# Patient Record
Sex: Male | Born: 1960
Health system: Southern US, Community
[De-identification: ages and names within clinical notes are randomized; demographics above are authoritative.]

## PROBLEM LIST (undated history)

## (undated) DIAGNOSIS — F32A Depression, unspecified: Secondary | ICD-10-CM

## (undated) DIAGNOSIS — H3320 Serous retinal detachment, unspecified eye: Secondary | ICD-10-CM

## (undated) DIAGNOSIS — F419 Anxiety disorder, unspecified: Secondary | ICD-10-CM

## (undated) DIAGNOSIS — N400 Enlarged prostate without lower urinary tract symptoms: Secondary | ICD-10-CM

## (undated) DIAGNOSIS — F329 Major depressive disorder, single episode, unspecified: Secondary | ICD-10-CM

## (undated) HISTORY — DX: Anxiety disorder, unspecified: F41.9

## (undated) HISTORY — DX: Benign prostatic hyperplasia without lower urinary tract symptoms: N40.0

## (undated) HISTORY — PX: DEEP NECK LYMPH NODE BIOPSY / EXCISION: SUR126

## (undated) HISTORY — PX: OTHER SURGICAL HISTORY: SHX169

## (undated) HISTORY — DX: Major depressive disorder, single episode, unspecified: F32.9

## (undated) HISTORY — PX: APPENDECTOMY: SHX54

## (undated) HISTORY — DX: Serous retinal detachment, unspecified eye: H33.20

## (undated) HISTORY — DX: Depression, unspecified: F32.A

---

## 2003-09-02 ENCOUNTER — Encounter: Payer: Self-pay | Admitting: Internal Medicine

## 2004-01-10 ENCOUNTER — Ambulatory Visit: Payer: Self-pay | Admitting: Internal Medicine

## 2004-01-17 ENCOUNTER — Ambulatory Visit: Payer: Self-pay | Admitting: Internal Medicine

## 2005-01-30 ENCOUNTER — Ambulatory Visit: Payer: Self-pay | Admitting: Internal Medicine

## 2005-02-06 ENCOUNTER — Ambulatory Visit: Payer: Self-pay | Admitting: Internal Medicine

## 2005-03-01 ENCOUNTER — Ambulatory Visit: Payer: Self-pay | Admitting: Internal Medicine

## 2006-02-11 ENCOUNTER — Ambulatory Visit: Payer: Self-pay | Admitting: Internal Medicine

## 2006-02-11 LAB — CONVERTED CEMR LAB
ALT: 21 units/L (ref 0–40)
AST: 26 units/L (ref 0–37)
Albumin: 4.1 g/dL (ref 3.5–5.2)
Alkaline Phosphatase: 53 units/L (ref 39–117)
BUN: 15 mg/dL (ref 6–23)
Basophils Absolute: 0 10*3/uL (ref 0.0–0.1)
Basophils Relative: 0.3 % (ref 0.0–1.0)
Bilirubin, Direct: 0.1 mg/dL (ref 0.0–0.3)
CO2: 31 meq/L (ref 19–32)
Calcium: 9.6 mg/dL (ref 8.4–10.5)
Chloride: 104 meq/L (ref 96–112)
Cholesterol: 176 mg/dL (ref 0–200)
Creatinine, Ser: 1.2 mg/dL (ref 0.4–1.5)
Eosinophils Absolute: 0 10*3/uL (ref 0.0–0.6)
Eosinophils Relative: 0.9 % (ref 0.0–5.0)
GFR calc Af Amer: 84 mL/min
GFR calc non Af Amer: 69 mL/min
Glucose, Bld: 84 mg/dL (ref 70–99)
HCT: 40.8 % (ref 39.0–52.0)
HDL: 43.4 mg/dL (ref >39.0)
Hemoglobin: 14.3 g/dL (ref 13.0–17.0)
LDL Cholesterol: 118 mg/dL — ABNORMAL HIGH (ref 0–99)
Lymphocytes Relative: 30.7 % (ref 12.0–46.0)
MCHC: 35 g/dL (ref 30.0–36.0)
MCV: 93.3 fL (ref 78.0–100.0)
Monocytes Absolute: 0.5 10*3/uL (ref 0.2–0.7)
Monocytes Relative: 10.7 % (ref 3.0–11.0)
Neutro Abs: 2.6 10*3/uL (ref 1.4–7.7)
Neutrophils Relative %: 57.4 % (ref 43.0–77.0)
Platelets: 207 10*3/uL (ref 150–400)
Potassium: 4.4 meq/L (ref 3.5–5.1)
RBC: 4.37 M/uL (ref 4.22–5.81)
RDW: 12.4 % (ref 11.5–14.6)
Sodium: 142 meq/L (ref 135–145)
TSH: 1.73 microintl units/mL (ref 0.35–5.50)
Total Bilirubin: 1 mg/dL (ref 0.3–1.2)
Total CHOL/HDL Ratio: 4.1
Total Protein: 7 g/dL (ref 6.0–8.3)
Triglycerides: 73 mg/dL (ref 0–149)
VLDL: 15 mg/dL (ref 0–40)
WBC: 4.5 10*3/uL (ref 4.5–10.5)

## 2006-02-15 ENCOUNTER — Ambulatory Visit: Payer: Self-pay | Admitting: Internal Medicine

## 2006-10-22 DIAGNOSIS — F411 Generalized anxiety disorder: Secondary | ICD-10-CM

## 2006-11-13 ENCOUNTER — Telehealth: Payer: Self-pay | Admitting: Internal Medicine

## 2007-01-30 ENCOUNTER — Ambulatory Visit: Payer: Self-pay | Admitting: Internal Medicine

## 2007-01-30 LAB — CONVERTED CEMR LAB
AST: 27 units/L (ref 0–37)
Basophils Relative: 0.1 % (ref 0.0–1.0)
Bilirubin Urine: NEGATIVE
Bilirubin, Direct: 0.2 mg/dL (ref 0.0–0.3)
Calcium: 10 mg/dL (ref 8.4–10.5)
Chloride: 105 meq/L (ref 96–112)
Cholesterol: 194 mg/dL (ref 0–200)
Creatinine, Ser: 1.2 mg/dL (ref 0.4–1.5)
Eosinophils Absolute: 0 10*3/uL (ref 0.0–0.6)
Eosinophils Relative: 1 % (ref 0.0–5.0)
Glucose, Urine, Semiquant: NEGATIVE
HCT: 41.4 % (ref 39.0–52.0)
Lymphocytes Relative: 30.8 % (ref 12.0–46.0)
MCHC: 33.1 g/dL (ref 30.0–36.0)
Monocytes Absolute: 0.5 10*3/uL (ref 0.2–0.7)
Platelets: 206 10*3/uL (ref 150–400)
RDW: 12.2 % (ref 11.5–14.6)
Sodium: 143 meq/L (ref 135–145)
TSH: 1.31 microintl units/mL (ref 0.35–5.50)
Total CHOL/HDL Ratio: 4.9
Triglycerides: 84 mg/dL (ref 0–149)
WBC Urine, dipstick: NEGATIVE
WBC: 4.6 10*3/uL (ref 4.5–10.5)

## 2007-02-18 ENCOUNTER — Ambulatory Visit: Payer: Self-pay | Admitting: Internal Medicine

## 2007-08-05 ENCOUNTER — Ambulatory Visit: Payer: Self-pay | Admitting: Internal Medicine

## 2007-08-06 ENCOUNTER — Telehealth: Payer: Self-pay | Admitting: Internal Medicine

## 2007-08-06 ENCOUNTER — Telehealth: Payer: Self-pay | Admitting: *Deleted

## 2007-08-07 ENCOUNTER — Ambulatory Visit: Payer: Self-pay | Admitting: Professional

## 2007-08-08 ENCOUNTER — Telehealth: Payer: Self-pay | Admitting: Internal Medicine

## 2007-08-11 ENCOUNTER — Ambulatory Visit: Payer: Self-pay | Admitting: Professional

## 2007-08-12 ENCOUNTER — Telehealth: Payer: Self-pay | Admitting: Internal Medicine

## 2007-08-18 ENCOUNTER — Ambulatory Visit: Payer: Self-pay | Admitting: Professional

## 2007-08-25 ENCOUNTER — Ambulatory Visit: Payer: Self-pay | Admitting: Professional

## 2007-09-01 ENCOUNTER — Ambulatory Visit: Payer: Self-pay | Admitting: Professional

## 2007-09-10 ENCOUNTER — Encounter: Payer: Self-pay | Admitting: Internal Medicine

## 2007-12-30 ENCOUNTER — Ambulatory Visit: Payer: Self-pay | Admitting: Family Medicine

## 2008-02-18 ENCOUNTER — Ambulatory Visit: Payer: Self-pay | Admitting: Internal Medicine

## 2008-02-18 LAB — CONVERTED CEMR LAB
ALT: 22 units/L (ref 0–53)
Albumin: 4.2 g/dL (ref 3.5–5.2)
Alkaline Phosphatase: 59 units/L (ref 39–117)
BUN: 18 mg/dL (ref 6–23)
Bilirubin, Direct: 0.1 mg/dL (ref 0.0–0.3)
Blood in Urine, dipstick: NEGATIVE
Calcium: 9.9 mg/dL (ref 8.4–10.5)
Chloride: 108 meq/L (ref 96–112)
Creatinine, Ser: 1.3 mg/dL (ref 0.4–1.5)
Eosinophils Absolute: 0 10*3/uL (ref 0.0–0.7)
GFR calc Af Amer: 76 mL/min
Glucose, Urine, Semiquant: NEGATIVE
Hemoglobin: 14.1 g/dL (ref 13.0–17.0)
Ketones, urine, test strip: NEGATIVE
Monocytes Absolute: 0.6 10*3/uL (ref 0.1–1.0)
Neutro Abs: 2.8 10*3/uL (ref 1.4–7.7)
Neutrophils Relative %: 51.5 % (ref 43.0–77.0)
Platelets: 181 10*3/uL (ref 150–400)
Potassium: 5.3 meq/L — ABNORMAL HIGH (ref 3.5–5.1)
Protein, U semiquant: NEGATIVE
RBC: 4.29 M/uL (ref 4.22–5.81)
Sodium: 146 meq/L — ABNORMAL HIGH (ref 135–145)
Total CHOL/HDL Ratio: 4.6
Triglycerides: 71 mg/dL (ref 0–149)
WBC: 5.3 10*3/uL (ref 4.5–10.5)

## 2008-03-15 ENCOUNTER — Ambulatory Visit: Payer: Self-pay | Admitting: Internal Medicine

## 2008-09-29 ENCOUNTER — Ambulatory Visit: Payer: Self-pay | Admitting: Family Medicine

## 2009-03-10 ENCOUNTER — Ambulatory Visit: Payer: Self-pay | Admitting: Internal Medicine

## 2009-03-10 LAB — CONVERTED CEMR LAB
ALT: 17 units/L (ref 0–53)
Albumin: 4.1 g/dL (ref 3.5–5.2)
Alkaline Phosphatase: 55 units/L (ref 39–117)
Basophils Absolute: 0 10*3/uL (ref 0.0–0.1)
Basophils Relative: 0 % (ref 0.0–3.0)
Bilirubin Urine: NEGATIVE
GFR calc non Af Amer: 84.3 mL/min (ref >60)
HCT: 40.5 % (ref 39.0–52.0)
HDL: 47.8 mg/dL (ref >39.00)
Hemoglobin: 13.4 g/dL (ref 13.0–17.0)
Ketones, urine, test strip: NEGATIVE
LDL Cholesterol: 103 mg/dL — ABNORMAL HIGH (ref 0–99)
Lymphocytes Relative: 37.8 % (ref 12.0–46.0)
Lymphs Abs: 1.6 10*3/uL (ref 0.7–4.0)
Monocytes Absolute: 0.4 10*3/uL (ref 0.1–1.0)
Monocytes Relative: 8.6 % (ref 3.0–12.0)
Neutro Abs: 2.3 10*3/uL (ref 1.4–7.7)
Neutrophils Relative %: 52.7 % (ref 43.0–77.0)
Nitrite: NEGATIVE
Potassium: 4.6 meq/L (ref 3.5–5.1)
Protein, U semiquant: NEGATIVE
RBC: 4.2 M/uL — ABNORMAL LOW (ref 4.22–5.81)
Specific Gravity, Urine: 1.02
TSH: 1.32 microintl units/mL (ref 0.35–5.50)
Total CHOL/HDL Ratio: 3
Total Protein: 7.3 g/dL (ref 6.0–8.3)

## 2009-03-24 ENCOUNTER — Ambulatory Visit: Payer: Self-pay | Admitting: Internal Medicine

## 2009-06-13 ENCOUNTER — Ambulatory Visit: Payer: Self-pay | Admitting: Internal Medicine

## 2009-06-13 DIAGNOSIS — R519 Headache, unspecified: Secondary | ICD-10-CM | POA: Insufficient documentation

## 2009-06-13 DIAGNOSIS — R51 Headache: Secondary | ICD-10-CM | POA: Insufficient documentation

## 2009-06-16 ENCOUNTER — Telehealth: Payer: Self-pay | Admitting: Internal Medicine

## 2009-06-16 ENCOUNTER — Ambulatory Visit: Payer: Self-pay | Admitting: Cardiology

## 2009-06-17 ENCOUNTER — Telehealth: Payer: Self-pay | Admitting: Internal Medicine

## 2009-06-18 ENCOUNTER — Emergency Department (HOSPITAL_COMMUNITY): Admission: EM | Admit: 2009-06-18 | Discharge: 2009-06-18 | Payer: Self-pay | Admitting: Family Medicine

## 2009-06-20 ENCOUNTER — Telehealth (INDEPENDENT_AMBULATORY_CARE_PROVIDER_SITE_OTHER): Payer: Self-pay | Admitting: *Deleted

## 2009-06-21 ENCOUNTER — Telehealth: Payer: Self-pay | Admitting: Internal Medicine

## 2009-06-22 ENCOUNTER — Telehealth: Payer: Self-pay | Admitting: Internal Medicine

## 2009-07-14 ENCOUNTER — Telehealth: Payer: Self-pay
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
## 2010-01-31 NOTE — Progress Notes (Signed)
Summary: headaches  STAT CT  Phone Note Call from Patient   Caller: Patient Call For: Birdie Sons MD Summary of Call: Has blurred vision in right eye and has gotten a little better as the day goes on. [phone]  [phone]  [phone] Feels ok and no headache in the am, but in the afternoon the headache comes back and lasts through the night.  Cannot even focus.  Initial call taken by: Lynann Beaver CMA,  June 16, 2009 1:35 PM  Follow-up for Phone Call        head ct without contrast Follow-up by: Gordy Savers  MD,  June 16, 2009 1:50 PM  Additional Follow-up for Phone Call Additional follow up Details #1::        Appt Scheduled.  Pt aware. Additional Follow-up by: Corky Mull,  June 17, 2009 9:17 AM
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
## 2010-01-31 NOTE — Progress Notes (Signed)
Summary: REQ FOR RETURN CALL  Phone Note Call from Patient   Caller: Patient  [phone] Summary of Call: Pt called to speak with Dr Kirtland Bouchard or Selena Batten, RN.... Pt adv that he feels that he is ready to go back to work but his employer is stating that he will need to have a work note saying he is no longer contagious.... Pt wants to know if he needs to come in for OV to determine this?... Pt requesting a return call at [phone].  Initial call taken by: Debbra Riding,  June 22, 2009 2:37 PM  Follow-up for Phone Call        forward to [facility] - Dr. Cato Mulligan pt.  Follow-up by: Duard Brady LPN,  June 23, 2009 8:28 AM  Additional Follow-up for Phone Call Additional follow up Details #1::        2nd VM today, pt wants to know what steps to take to go back to work. Sid Falcon LPN  June 23, 2009 3:42 PM     Additional Follow-up for Phone Call Additional follow up Details #2::    can return to work---he never was contagious Follow-up by: Birdie Sons MD,  June 23, 2009 4:48 PM  Additional Follow-up for Phone Call Additional follow up Details #3:: Details for Additional Follow-up Action Taken: will do. Additional Follow-up by: Gladis Riffle, RN,  June 24, 2009 10:19 AM   Appended Document: REQ FOR RETURN CALL Pt is that of Dr Cato Mulligan but was seen by Dr Kirtland Bouchard during OV for shingles.... Pt thought he needed to go through Dr K for return to work.

## 2010-01-31 NOTE — Progress Notes (Signed)
Summary: headaches  Phone Note Call from Patient   Caller: Patient Call For: Birdie Sons MD Summary of Call: Pt needs RX stronger for headace ?? Aleve?  ASA? [phone]  Initial call taken by: Lynann Beaver CMA,  June 17, 2009 9:38 AM  Follow-up for Phone Call        generic Vicodin 5/500, number 50, 1 or two every 6 hours as needed for pain Follow-up by: Gordy Savers  MD,  June 17, 2009 12:38 PM    New/Updated Medications: HYDROCODONE-ACETAMINOPHEN 5-500 MG TABS (HYDROCODONE-ACETAMINOPHEN) one - two by mouth q 6 hours as needed pain Prescriptions: HYDROCODONE-ACETAMINOPHEN 5-500 MG TABS (HYDROCODONE-ACETAMINOPHEN) one - two by mouth q 6 hours as needed pain  #50 x 0   Entered by:   Lynann Beaver CMA   Authorized by:   Gordy Savers  MD   Signed by:   Lynann Beaver CMA on 06/17/2009   Method used:   Telephoned to ...       [facility] [address] [id number]       [address]       [place], [place]  [address]       Ph: [phone]       Fax: [phone]   RxID:   [id number]  Pt notified.

## 2010-01-31 NOTE — Progress Notes (Signed)
Summary: FMLA ppwk - refax - Dr. Cato Mulligan pt.  Phone Note Call from Patient   Caller: Patient Call For: Birdie Sons MD Summary of Call: questions about FMLA ppwk - filled out ? faxed?  Needs to go to [employer] fax # [phone] call pt back at [phone] Initial call taken by: Duard Brady LPN,  July 14, 2009 12:04 PM  Follow-up for Phone Call        attempt to call - ans mach - LMTCB   he is a pt of Dr. Cato Mulligan - Dr. Amador Cunas filled out ppwk and it was faxed to [facility] on 07/01/09 , [facility] will refax today. KIK Follow-up by: Duard Brady LPN,  July 14, 2009 12:07 PM

## 2010-01-31 NOTE — Progress Notes (Signed)
Summary: shingles  Phone Note Call from Patient   Caller: Patient Call For: Birdie Sons MD Summary of Call: Pt was diagnosed with shingles at [facility] Saturday.  Now is complaining of nausea.  Is on an antiviral.  [phone] [facility] Valtrex  was prescribed. Initial call taken by: Lynann Beaver CMA,  June 21, 2009 2:37 PM  Follow-up for Phone Call        generic Phenergan 25 mg, number 30, 1 every 4 hours as needed for nausea Follow-up by: Gordy Savers  MD,  June 21, 2009 3:15 PM    New/Updated Medications: PROMETHAZINE HCL 25 MG TABS (PROMETHAZINE HCL) one by mouth q 4 hours as needed nausea Prescriptions: PROMETHAZINE HCL 25 MG TABS (PROMETHAZINE HCL) one by mouth q 4 hours as needed nausea  #30 x 0   Entered by:   Lynann Beaver CMA   Authorized by:   Gordy Savers  MD   Signed by:   Lynann Beaver CMA on 06/21/2009   Method used:   [facility]        [facility] [address] [id number]       [address]       [place], [place]  [address]       Ph: [phone]       Fax: [phone]   RxID:   [id number]  Pt. notified.

## 2010-01-31 NOTE — Assessment & Plan Note (Signed)
Summary: cpx//ccm   Vital Signs:  Patient profile:   50 year old male Height:      71 inches Weight:      165 pounds BMI:     23.10 Temp:     98.0 degrees F oral BP sitting:   110 / 80  (left arm) Cuff size:   regular  Vitals Entered By: Kern Reap CMA Duncan Dull) (March 24, 2009 8:14 AM) CC: yearly physical Is Patient Diabetic? No   CC:  yearly physical.  History of Present Illness: cpx  has some generalized joint stiffness in the a.m. resolves once he is up and moving.  All other systems reviewed and were negative   Current Problems (verified): 1)  Physical Examination  (ICD-V70.0) 2)  Anxiety  (ICD-300.00)  Current Medications (verified): 1)  Multivitamins   Tabs (Multiple Vitamin) .... Once Daily 2)  Aspirin 81 Mg  Tbec (Aspirin) .... Once Daily 3)  Gabapentin 300 Mg Caps (Gabapentin) .... Take One Tab in The Am and Two Tabs in The Pm  Allergies (verified): No Known Drug Allergies  Past History:  Past Medical History: Last updated: 10/22/2006 Anxiety  Past Surgical History: Last updated: 03-01-07 Denies surgical history  Family History: Last updated: 03-01-07 mother deceased valvular heart dzs 67 yo Family History of CAD Male 1st degree relative -father Family History of Prostate CA 1st degree relative -father Family History Hypertension-father brother with psoriatic arthritis  Social History: Last updated: 03/01/07 Occupation: Married Never Smoked Regular exercise-no  Risk Factors: Exercise: no (03-01-2007)  Risk Factors: Smoking Status: never (2007/03/01)  Review of Systems       All other systems reviewed and were negative   Physical Exam  General:  alert and well-developed.   Head:  normocephalic and atraumatic.   Eyes:  pupils equal and pupils round.   Ears:  R ear normal and L ear normal.   Neck:  No deformities, masses, or tenderness noted. Chest Wall:  no deformities and no tenderness.   Lungs:  Normal respiratory  effort, chest expands symmetrically. Lungs are clear to auscultation, no crackles or wheezes. Heart:  normal rate and regular rhythm.   Abdomen:  soft and non-tender.   Rectal:  No external abnormalities noted. Normal sphincter tone. No rectal masses or tenderness. Prostate:  no gland enlargement and no nodules.   Msk:  No deformity or scoliosis noted of thoracic or lumbar spine.   Pulses:  R radial normal and L radial normal.   Neurologic:  cranial nerves II-XII intact and gait normal.     Impression & Recommendations:  Problem # 1:  PHYSICAL EXAMINATION (ICD-V70.0) health maint UTD discussed need for regular exercise  Problem # 2:  ANXIETY (ICD-300.00) sees psychiatry (neuronitn) (plovsky) meds have been stable. if asked, I'll be happy to write for neurontin  Complete Medication List: 1)  Multivitamins Tabs (Multiple vitamin) .... Once daily 2)  Aspirin 81 Mg Tbec (Aspirin) .... Once daily 3)  Gabapentin 300 Mg Caps (Gabapentin) .... Take one tab in the am and two tabs in the pm   Immunization History:  Tetanus/Td Immunization History:    Tetanus/Td:  historical (01/02/2003)  Influenza Immunization History:    Influenza:  historical (11/01/2008)   Preventive Care Screening  Last Tetanus Booster:    Date:  01/02/2003    Results:  Historical

## 2010-04-02 HISTORY — PX: RETINAL DETACHMENT REPAIR W/ SCLERAL BUCKLE LE: SHX2338

## 2010-04-09 ENCOUNTER — Emergency Department (HOSPITAL_COMMUNITY): Payer: Managed Care, Other (non HMO)

## 2010-04-09 ENCOUNTER — Emergency Department (HOSPITAL_COMMUNITY)
Admission: EM | Admit: 2010-04-09 | Discharge: 2010-04-09 | Disposition: A | Discharge status: Home / Self Care | Payer: Managed Care, Other (non HMO) | Attending: Emergency Medicine | Admitting: Emergency Medicine

## 2010-04-09 DIAGNOSIS — H33009 Unspecified retinal detachment with retinal break, unspecified eye: Secondary | ICD-10-CM | POA: Insufficient documentation

## 2010-04-09 LAB — BASIC METABOLIC PANEL
BUN: 17 mg/dL (ref 6–23)
GFR calc non Af Amer: >60 mL/min (ref >60)
Glucose, Bld: 93 mg/dL (ref 70–99)
Potassium: 3.6 mEq/L (ref 3.5–5.1)

## 2010-04-09 LAB — DIFFERENTIAL
Eosinophils Relative: 1 % (ref 0–5)
Lymphocytes Relative: 31 % (ref 12–46)
Lymphs Abs: 1.9 10*3/uL (ref 0.7–4.0)
Monocytes Absolute: 0.7 10*3/uL (ref 0.1–1.0)

## 2010-04-09 LAB — CBC
HCT: 38.2 % — ABNORMAL LOW (ref 39.0–52.0)
MCV: 92 fL (ref 78.0–100.0)
RDW: 13.1 % (ref 11.5–15.5)
WBC: 6.3 10*3/uL (ref 4.0–10.5)

## 2010-05-03 ENCOUNTER — Other Ambulatory Visit (INDEPENDENT_AMBULATORY_CARE_PROVIDER_SITE_OTHER): Payer: Managed Care, Other (non HMO) | Admitting: Internal Medicine

## 2010-05-03 DIAGNOSIS — Z0000 Encounter for general adult medical examination without abnormal findings: Secondary | ICD-10-CM

## 2010-05-03 LAB — BASIC METABOLIC PANEL
BUN: 18 mg/dL (ref 6–23)
CO2: 30 mEq/L (ref 19–32)
Calcium: 9.7 mg/dL (ref 8.4–10.5)
Creatinine, Ser: 1.1 mg/dL (ref 0.4–1.5)
Glucose, Bld: 88 mg/dL (ref 70–99)

## 2010-05-03 LAB — HEPATIC FUNCTION PANEL
Alkaline Phosphatase: 57 U/L (ref 39–117)
Bilirubin, Direct: 0.1 mg/dL (ref 0.0–0.3)

## 2010-05-03 LAB — POCT URINALYSIS DIPSTICK
Blood, UA: NEGATIVE
Ketones, UA: NEGATIVE
Leukocytes, UA: NEGATIVE
Nitrite, UA: NEGATIVE
Protein, UA: NEGATIVE
pH, UA: 7

## 2010-05-03 LAB — CBC WITH DIFFERENTIAL/PLATELET
Basophils Absolute: 0 10*3/uL (ref 0.0–0.1)
Eosinophils Absolute: 0 10*3/uL (ref 0.0–0.7)
Lymphocytes Relative: 29 % (ref 12.0–46.0)
MCHC: 33.4 g/dL (ref 30.0–36.0)
Neutrophils Relative %: 60.9 % (ref 43.0–77.0)
Platelets: 246 10*3/uL (ref 150.0–400.0)
RBC: 4 Mil/uL — ABNORMAL LOW (ref 4.22–5.81)
RDW: 14.1 % (ref 11.5–14.6)

## 2010-05-03 LAB — LIPID PANEL
HDL: 45.8 mg/dL (ref >39.00)
Total CHOL/HDL Ratio: 4
Triglycerides: 83 mg/dL (ref 0.0–149.0)
VLDL: 16.6 mg/dL (ref 0.0–40.0)

## 2010-05-08 ENCOUNTER — Other Ambulatory Visit (INDEPENDENT_AMBULATORY_CARE_PROVIDER_SITE_OTHER): Payer: Managed Care, Other (non HMO) | Admitting: Internal Medicine

## 2010-05-08 DIAGNOSIS — D509 Iron deficiency anemia, unspecified: Secondary | ICD-10-CM

## 2010-05-08 LAB — IBC PANEL: Transferrin: 278.8 mg/dL (ref 212.0–360.0)

## 2010-05-08 LAB — FERRITIN: Ferritin: 45.7 ng/mL (ref 22.0–322.0)

## 2010-05-09 ENCOUNTER — Encounter: Payer: Self-pay | Admitting: Internal Medicine

## 2010-05-10 ENCOUNTER — Ambulatory Visit (INDEPENDENT_AMBULATORY_CARE_PROVIDER_SITE_OTHER): Payer: Managed Care, Other (non HMO) | Admitting: Internal Medicine

## 2010-05-10 ENCOUNTER — Encounter: Payer: Self-pay | Admitting: Internal Medicine

## 2010-05-10 VITALS — BP 110/76 | HR 76 | Temp 98.2°F | Ht 71.5 in | Wt 158.0 lb

## 2010-05-10 DIAGNOSIS — Z0000 Encounter for general adult medical examination without abnormal findings: Secondary | ICD-10-CM

## 2010-05-10 DIAGNOSIS — D649 Anemia, unspecified: Secondary | ICD-10-CM

## 2010-05-10 NOTE — Assessment & Plan Note (Signed)
Reviewed labs--- No concerns Will repeat CBC in 3 months He needs screening colonoscopy

## 2010-05-10 NOTE — Progress Notes (Signed)
  Subjective:    Patient ID: Alexander Gonzales, male    DOB: [birth year], 50 y.o.   MRN: [id number]  HPI cpx  Anxiety---sees psychiatry  Past Medical History  Diagnosis Date  . Anxiety    No past surgical history on file.  reports that he quit smoking about 32 years ago. His smoking use included Cigarettes. He does not have any smokeless tobacco history on file. His alcohol and drug histories not on file. family history includes Arthritis in his brother; Heart disease in his father and mother; Hypertension in his father; and Prostate cancer in his father. No Known Allergies   Review of Systems  patient denies chest pain, shortness of breath, orthopnea. Denies lower extremity edema, abdominal pain,  change in bowel movements. Patient denies rashes, musculoskeletal complaints. No other specific complaints in a complete review of systems. Some weight loss with recent stressors: work, detached retina.     Objective:   Physical Exam Well-developed male in no acute distress. HEENT exam atraumatic, normocephalic, extraocular muscles are intact. Conjunctivae are pink without exudate. Neck is supple without lymphadenopathy, thyromegaly, jugular venous distention. Chest is clear to auscultation without increased work of breathing. Cardiac exam S1-S2 are regular. The PMI is normal. No significant murmurs or gallops. Abdominal exam active bowel sounds, soft, nontender. No abdominal bruits. Extremities no clubbing cyanosis or edema. Peripheral pulses are normal without bruits. Neurologic exam alert and oriented without any motor or sensory deficits. Rectal exam normal tone prostate normal size without masses or asymmetry.        Assessment & Plan:

## 2010-06-29 ENCOUNTER — Ambulatory Visit (AMBULATORY_SURGERY_CENTER): Payer: Managed Care, Other (non HMO) | Admitting: *Deleted

## 2010-06-29 VITALS — Ht 72.0 in | Wt 158.2 lb

## 2010-06-29 DIAGNOSIS — Z1211 Encounter for screening for malignant neoplasm of colon: Secondary | ICD-10-CM

## 2010-06-29 MED ORDER — PEG-KCL-NACL-NASULF-NA ASC-C 100 G PO SOLR: ORAL | Status: DC

## 2010-07-12 ENCOUNTER — Telehealth: Payer: Self-pay | Admitting: Internal Medicine

## 2010-07-13 ENCOUNTER — Telehealth: Payer: Self-pay | Admitting: *Deleted

## 2010-07-13 NOTE — Telephone Encounter (Signed)
Pt. Returned my call.  He'd eaten some salad and corn the other day and wanted to be reassured he didn't have to [facility].  Advised him to drink plenty of liquids.

## 2010-07-14 ENCOUNTER — Encounter: Payer: Self-pay | Admitting: Internal Medicine

## 2010-07-14 ENCOUNTER — Ambulatory Visit (AMBULATORY_SURGERY_CENTER): Payer: Managed Care, Other (non HMO) | Admitting: Internal Medicine

## 2010-07-14 VITALS — BP 100/58 | HR 61 | Temp 97.2°F | Resp 18 | Ht 72.0 in | Wt 160.0 lb

## 2010-07-14 DIAGNOSIS — Z1211 Encounter for screening for malignant neoplasm of colon: Secondary | ICD-10-CM

## 2010-07-14 DIAGNOSIS — K648 Other hemorrhoids: Secondary | ICD-10-CM

## 2010-07-14 HISTORY — PX: COLONOSCOPY: SHX174

## 2010-07-14 MED ORDER — SODIUM CHLORIDE 0.9 % IV SOLN: 500.0000 mL | INTRAVENOUS | Status: DC

## 2010-07-14 NOTE — Patient Instructions (Signed)
Normal colon  Internal hemorrhoids  Recall colonoscopy in 10 years

## 2010-07-17 ENCOUNTER — Telehealth: Payer: Self-pay | Admitting: *Deleted

## 2010-07-17 NOTE — Telephone Encounter (Signed)
No identifier on answering machine, no message left. 

## 2010-08-09 NOTE — Op Note (Signed)
NAME:  Alexander Gonzales, Alexander Gonzales            ACCOUNT NO.:  [id number]  MEDICAL RECORD NO.:  [id number]           PATIENT TYPE:  E  LOCATION:  [facility]                         FACILITY:  [facility]  PHYSICIAN:  Alford Highland. Rankin, M.D.   DATE OF BIRTH:  [birth year]  DATE OF PROCEDURE:  04/09/2010 DATE OF DISCHARGE:                              OPERATIVE REPORT   PREOPERATIVE DIAGNOSIS:  Rhegmatogenous retinal detachment, macula off, phakic of the left eye.  POSTOPERATIVE DIAGNOSIS:  Rhegmatogenous retinal detachment, left eye, macula off, phakic.  PROCEDURES: 1. Scleral buckle retinal cryopexy, left eye using 240, 70, and 287     elements. 2. Injection of vitreous substitute SF6 100% concentration, 0.25 mL. 3. Aqueous paracentesis to soften the globe.  SURGEON:  Alford Highland. Rankin, MD  ANESTHESIA:  General endotracheal anesthesia.  INDICATIONS FOR PROCEDURE:  The patient is a 50 year old man who has profound vision loss on basis of rhegmatogenous detachment in the left eye found yesterday in the office to have a small horseshoe tip break at the 2 o'clock position with a wedge shape about 1 to 1-1/2 clock hour detachment extending from the retinal periphery posteriorly to the macula splitting the foveal region.  B-scan confirmed that there was minimal separation of vitreous in the remainder of the retina suggesting poor vitreous syneresis, thus not a good candidate for pneumatic retinopexy alone in my judgment.  For this reason, the patient was scheduled for scleral buckle retinal cryopexy.  The patient understands the risks of anesthesia, including the rare occurrence of death, loss of the eye including but not limited to from the condition as well as surgical repair, hemorrhage, infection, scarring, need for further surgery, no change in vision, loss of vision, progressive disease despite intervention.  Proper signed consent was obtained.  DESCRIPTION OF PROCEDURE:  The patient was taken to the  operating room. In the operating room, appropriate monitors followed by mild sedation. Left periocular region was identified by the operating staff and then general endotracheal anesthesia was instituted without difficulty.  Left periocular region was sterilely prepped and draped in usual ophthalmic fashion.  Lid speculum was applied.  Conjunctiva peritomy fashioned at 360 degrees.  Rectus muscles isolated on 2-0 silk ties.  Ophthalmoscopy confirmed the extent of the detachment and location of the tear. Retinal cryopexy was applied to the tear.  The patient had detachment from the 1 o'clock hour straddle to the 2-3:30 meridian.  Break was at 2 o'clock.  287 explant was selected, placed into the lateral rectus muscles with a 240 encircling band which was temporarily secured in the inferonasal quadrant with a 70 sleeve.  The buckle was temporarily tied. Anterior chamber was then entered with a 30-gauge needle to soften the globe to allow for excellent scleral indentation.  Peripherally, the retinal detachment was shallow but posteriorly it was more pronounced and with subretinal fluid.  This was not going to be an easy case to drain it externally.  The buckle was then tightened to appropriate tension.  Excellent support to the break was confirmed.  At this time, the remainder of the buckle was then secured with 5-0 Mersilene in each  of the quadrants.  The band was properly secured to appropriate tension to provide vitreous base support.  No complications occurred.  Band was irrigated with bug juice.  Conjunctival tenons were then brought forward and closed with interrupted 7-0 Vicryl sutures. Second look at ophthalmoscopy confirmed that there was still subretinal fluid and a small fold through the break at 2 o'clock and for this reason, I was concerned that this might lead to persistent detachment. For this reason, I elected to place filtered SF6 0.2 mL into the vitreous cavity with direct  observation.  This will allow for positioning and to re-flatten the macula but also to secure the break temporally.  The patient tolerated the procedure without complication.  The patient was taken to PACU in good stable addition.  After sterile patch and Fox shield were then applied, he was awakened from anesthesia.     Alford Highland Rankin, M.D.     GAR/MEDQ  D:  04/09/2010  T:  04/10/2010  Job:  [id number]  cc:   Richarda Overlie, M.D. Marcelyn Bruins. Nile Riggs, M.D.  Electronically Signed by Fawn Kirk M.D. on 08/09/2010 02:39:34 PM

## 2010-08-10 ENCOUNTER — Other Ambulatory Visit (INDEPENDENT_AMBULATORY_CARE_PROVIDER_SITE_OTHER): Payer: Managed Care, Other (non HMO)

## 2010-08-10 DIAGNOSIS — N529 Male erectile dysfunction, unspecified: Secondary | ICD-10-CM

## 2010-08-10 DIAGNOSIS — D649 Anemia, unspecified: Secondary | ICD-10-CM

## 2010-08-10 LAB — CBC WITH DIFFERENTIAL/PLATELET
Basophils Relative: 0.6 % (ref 0.0–3.0)
Eosinophils Absolute: 0 10*3/uL (ref 0.0–0.7)
Hemoglobin: 13 g/dL (ref 13.0–17.0)
MCHC: 33.6 g/dL (ref 30.0–36.0)
MCV: 95.3 fl (ref 78.0–100.0)
Monocytes Absolute: 0.3 10*3/uL (ref 0.1–1.0)
Neutro Abs: 2 10*3/uL (ref 1.4–7.7)
Neutrophils Relative %: 57.5 % (ref 43.0–77.0)
RBC: 4.05 Mil/uL — ABNORMAL LOW (ref 4.22–5.81)

## 2010-11-06 NOTE — Telephone Encounter (Signed)
done

## 2011-05-10 ENCOUNTER — Other Ambulatory Visit (INDEPENDENT_AMBULATORY_CARE_PROVIDER_SITE_OTHER): Payer: Managed Care, Other (non HMO)

## 2011-05-10 DIAGNOSIS — Z0000 Encounter for general adult medical examination without abnormal findings: Secondary | ICD-10-CM

## 2011-05-10 LAB — BASIC METABOLIC PANEL
BUN: 25 mg/dL — ABNORMAL HIGH (ref 6–23)
Calcium: 9.7 mg/dL (ref 8.4–10.5)
GFR: 79.87 mL/min (ref >60.00)
Glucose, Bld: 86 mg/dL (ref 70–99)

## 2011-05-10 LAB — HEPATIC FUNCTION PANEL
Alkaline Phosphatase: 46 U/L (ref 39–117)
Bilirubin, Direct: 0 mg/dL (ref 0.0–0.3)

## 2011-05-10 LAB — POCT URINALYSIS DIPSTICK
Bilirubin, UA: NEGATIVE
Ketones, UA: NEGATIVE
Leukocytes, UA: NEGATIVE

## 2011-05-10 LAB — LIPID PANEL
Cholesterol: 212 mg/dL — ABNORMAL HIGH (ref 0–200)
HDL: 55.5 mg/dL (ref >39.00)
VLDL: 11.6 mg/dL (ref 0.0–40.0)

## 2011-05-10 LAB — CBC WITH DIFFERENTIAL/PLATELET
Basophils Absolute: 0 10*3/uL (ref 0.0–0.1)
Lymphocytes Relative: 32.9 % (ref 12.0–46.0)
Monocytes Relative: 9 % (ref 3.0–12.0)
Platelets: 187 10*3/uL (ref 150.0–400.0)
RDW: 13.9 % (ref 11.5–14.6)

## 2011-05-17 ENCOUNTER — Encounter: Payer: Self-pay | Admitting: Internal Medicine

## 2011-05-17 ENCOUNTER — Ambulatory Visit (INDEPENDENT_AMBULATORY_CARE_PROVIDER_SITE_OTHER): Payer: Managed Care, Other (non HMO) | Admitting: Internal Medicine

## 2011-05-17 VITALS — BP 100/60 | HR 72 | Temp 98.6°F | Resp 16 | Wt 173.0 lb

## 2011-05-17 DIAGNOSIS — Z0000 Encounter for general adult medical examination without abnormal findings: Secondary | ICD-10-CM

## 2011-05-17 NOTE — Progress Notes (Signed)
Patient ID: Alexander Gonzales, male   DOB: Sep 29, 1960, 51 y.o.   MRN: [id number] cpx  Hx of shingles--wonders whether shingles vaccine is appropriate  Past Medical History  Diagnosis Date  . Anxiety   . Detached retina     April 2012  . Depression     History   Social History  . Marital Status: Married    Spouse Name: N/A    Number of Children: N/A  . Years of Education: N/A   Occupational History  . Not on file.   Social History Main Topics  . Smoking status: Former Smoker    Types: Cigarettes    Quit date: 05/10/1978  . Smokeless tobacco: Not on file  . Alcohol Use: Yes     occasional alcohol intake  . Drug Use: No  . Sexually Active: Not on file   Other Topics Concern  . Not on file   Social History Narrative  . No narrative on file    Past Surgical History  Procedure Date  . Retinal detachment repair w/ scleral buckle le 04/2010  . Deep neck lymph node biopsy / excision     rt neck  . Appendectomy   . Fracture left ankle   . Colonoscopy 07/14/2010    hemorrhoids    Family History  Problem Relation Age of Onset  . Heart disease Mother   . Heart disease Father   . Prostate cancer Father   . Hypertension Father   . Arthritis Brother     psoriatic    No Known Allergies  Current Outpatient Prescriptions on File Prior to Visit  Medication Sig Dispense Refill  . aspirin 81 MG tablet Take 81 mg by mouth daily.        . clorazepate (TRANXENE) 15 MG tablet Take 15 mg by mouth 2 (two) times daily.        Marland Kitchen FLUoxetine (PROZAC) 40 MG capsule Take 40 mg by mouth daily.        Marland Kitchen gabapentin (NEURONTIN) 300 MG capsule Take 600 mg by mouth 2 (two) times daily.       . Multiple Vitamin (MULTIVITAMIN) tablet Take 1 tablet by mouth daily.        Marland Kitchen ketorolac (ACULAR) 0.5 % ophthalmic solution        Current Facility-Administered Medications on File Prior to Visit  Medication Dose Route Frequency Provider Last Rate Last Dose  . 0.9 %  sodium chloride infusion   500 mL Intravenous Continuous Iva Boop, MD         patient denies chest pain, shortness of breath, orthopnea. Denies lower extremity edema, abdominal pain, change in appetite, change in bowel movements. Patient denies rashes, musculoskeletal complaints. No other specific complaints in a complete review of systems.   BP 100/60  Pulse 72  Temp(Src) 98.6 F (37 C) (Oral)  Resp 16  Wt 173 lb (78.472 kg) Well-developed male in no acute distress. HEENT exam atraumatic, normocephalic, extraocular muscles are intact. Conjunctivae are pink without exudate. Neck is supple without lymphadenopathy, thyromegaly, jugular venous distention. Chest is clear to auscultation without increased work of breathing. Cardiac exam S1-S2 are regular. The PMI is normal. No significant murmurs or gallops. Abdominal exam active bowel sounds, soft, nontender. No abdominal bruits. Extremities no clubbing cyanosis or edema. Peripheral pulses are normal without bruits. Neurologic exam alert and oriented without any motor or sensory deficits. Rectal exam normal tone prostate normal size without masses or asymmetry.   A/P- well visit- health  maint UTD

## 2011-05-17 NOTE — Patient Instructions (Signed)
Call your insurance company and see if they will cover shingles vaccine. If they will, call us and we will give it to you  
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
## 2012-06-13 ENCOUNTER — Other Ambulatory Visit (INDEPENDENT_AMBULATORY_CARE_PROVIDER_SITE_OTHER): Payer: Managed Care, Other (non HMO)

## 2012-06-13 DIAGNOSIS — Z0000 Encounter for general adult medical examination without abnormal findings: Secondary | ICD-10-CM

## 2012-06-13 LAB — HEPATIC FUNCTION PANEL
AST: 28 U/L (ref 0–37)
Albumin: 3.8 g/dL (ref 3.5–5.2)
Alkaline Phosphatase: 52 U/L (ref 39–117)
Total Protein: 6.5 g/dL (ref 6.0–8.3)

## 2012-06-13 LAB — BASIC METABOLIC PANEL
BUN: 26 mg/dL — ABNORMAL HIGH (ref 6–23)
Calcium: 9.4 mg/dL (ref 8.4–10.5)
GFR: 72.26 mL/min (ref >60.00)
Potassium: 4.7 mEq/L (ref 3.5–5.1)

## 2012-06-13 LAB — POCT URINALYSIS DIPSTICK
Blood, UA: NEGATIVE
Glucose, UA: NEGATIVE
Nitrite, UA: NEGATIVE
Protein, UA: NEGATIVE
Urobilinogen, UA: 0.2

## 2012-06-13 LAB — LIPID PANEL
Cholesterol: 192 mg/dL (ref 0–200)
VLDL: 17.2 mg/dL (ref 0.0–40.0)

## 2012-06-13 LAB — TSH: TSH: 1.19 u[IU]/mL (ref 0.35–5.50)

## 2012-06-13 LAB — CBC WITH DIFFERENTIAL/PLATELET
Basophils Relative: 0.4 % (ref 0.0–3.0)
Eosinophils Absolute: 0 10*3/uL (ref 0.0–0.7)
MCHC: 33.6 g/dL (ref 30.0–36.0)
MCV: 93.9 fl (ref 78.0–100.0)
Monocytes Absolute: 0.5 10*3/uL (ref 0.1–1.0)
Neutrophils Relative %: 45.5 % (ref 43.0–77.0)
Platelets: 247 10*3/uL (ref 150.0–400.0)
RBC: 4.15 Mil/uL — ABNORMAL LOW (ref 4.22–5.81)

## 2012-06-13 LAB — PSA: PSA: 1.15 ng/mL (ref 0.10–4.00)

## 2012-06-20 ENCOUNTER — Encounter: Payer: Self-pay | Admitting: Internal Medicine

## 2012-06-20 ENCOUNTER — Ambulatory Visit (INDEPENDENT_AMBULATORY_CARE_PROVIDER_SITE_OTHER): Payer: Managed Care, Other (non HMO) | Admitting: Internal Medicine

## 2012-06-20 VITALS — BP 124/82 | HR 72 | Temp 98.0°F | Ht 72.0 in | Wt 176.0 lb

## 2012-06-20 DIAGNOSIS — Z0000 Encounter for general adult medical examination without abnormal findings: Secondary | ICD-10-CM

## 2012-06-20 NOTE — Progress Notes (Signed)
Patient ID: Alexander Gonzales, male   DOB: 1960-02-27, 52 y.o.   MRN: [id number] cpx  Past Medical History  Diagnosis Date  . Anxiety   . Detached retina     April 2012  . Depression     History   Social History  . Marital Status: Married    Spouse Name: N/A    Number of Children: N/A  . Years of Education: N/A   Occupational History  . Not on file.   Social History Main Topics  . Smoking status: Former Smoker    Types: Cigarettes    Quit date: 05/10/1978  . Smokeless tobacco: Not on file  . Alcohol Use: Yes     Comment: occasional alcohol intake  . Drug Use: No  . Sexually Active: Not on file   Other Topics Concern  . Not on file   Social History Narrative  . No narrative on file    Past Surgical History  Procedure Laterality Date  . Retinal detachment repair w/ scleral buckle le  04/2010  . Deep neck lymph node biopsy / excision      rt neck  . Appendectomy    . Fracture left ankle    . Colonoscopy  07/14/2010    hemorrhoids    Family History  Problem Relation Age of Onset  . Heart disease Mother   . Heart disease Father   . Prostate cancer Father   . Hypertension Father   . Arthritis Brother     psoriatic    No Known Allergies  Current Outpatient Prescriptions on File Prior to Visit  Medication Sig Dispense Refill  . aspirin 81 MG tablet Take 81 mg by mouth daily.        . Multiple Vitamin (MULTIVITAMIN) tablet Take 1 tablet by mouth daily.         No current facility-administered medications on file prior to visit.     patient denies chest pain, shortness of breath, orthopnea. Denies lower extremity edema, abdominal pain, change in appetite, change in bowel movements. Patient denies rashes, musculoskeletal complaints. No other specific complaints in a complete review of systems.   BP 124/82  Pulse 72  Temp(Src) 98 F (36.7 C) (Oral)  Ht 6' (1.829 m)  Wt 176 lb (79.833 kg)  BMI 23.86 kg/m2   well-developed well-nourished male in no  acute distress. HEENT exam atraumatic, normocephalic, neck supple without jugular venous distention. Chest clear to auscultation cardiac exam S1-S2 are regular. Abdominal exam overweight with bowel sounds, soft and nontender. Extremities no edema. Neurologic exam is alert with a normal gait.  Well Visit- health maint utd

## 2012-09-16 ENCOUNTER — Encounter: Payer: Self-pay | Admitting: Internal Medicine

## 2012-09-20 IMAGING — CR DG CHEST 2V
2 series · 2 of 2 positions shown · non-contrast
Comparison: None.

CLINICAL DATA: Preoperative respiratory exam.

CHEST - 2 VIEW

[w chest pa]
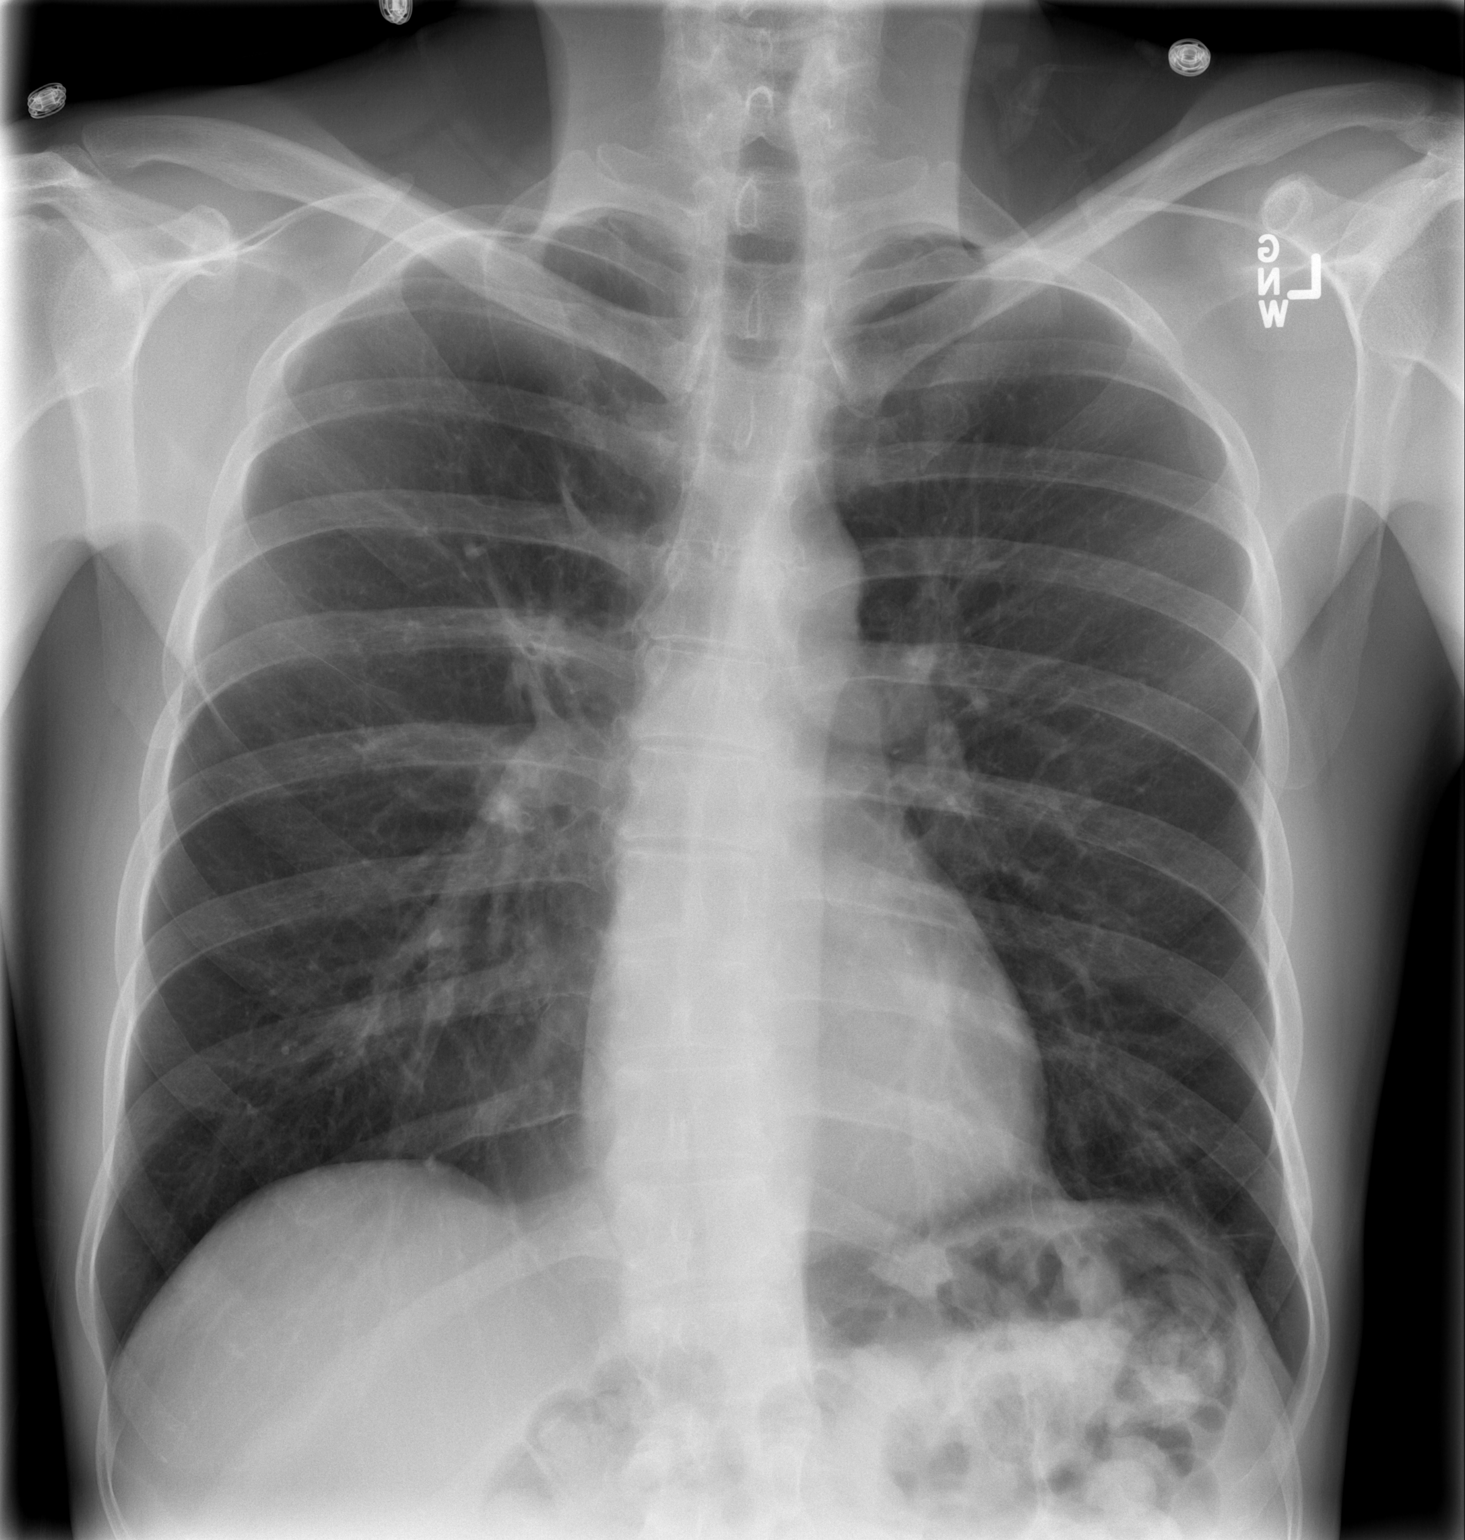

[w chest lat]
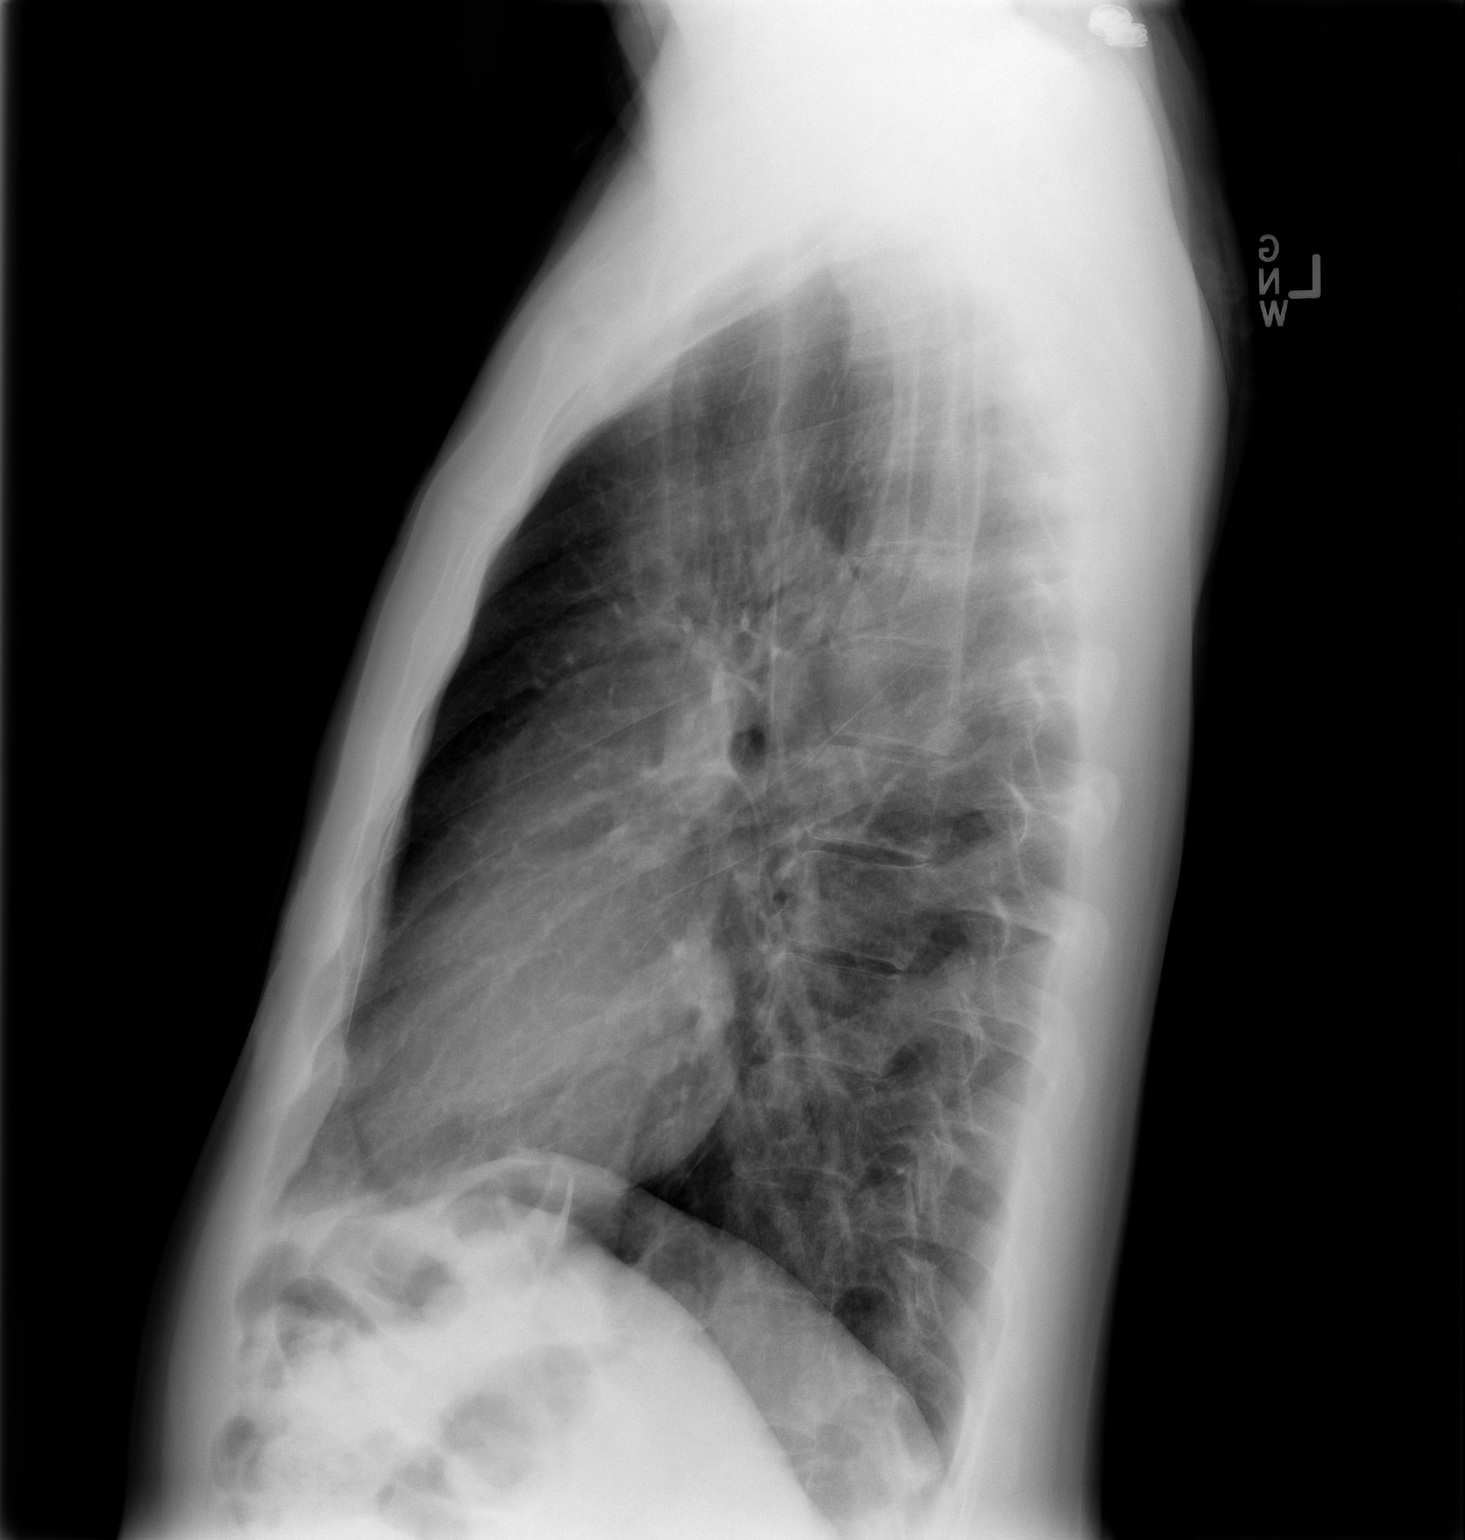

[2 of 2 positions shown; findings below may reference images not displayed]

FINDINGS: The heart size and vascularity are normal and the lungs
are clear.  No significant osseous abnormality.
IMPRESSION: No acute disease in the chest.

## 2012-11-06 ENCOUNTER — Other Ambulatory Visit: Payer: Self-pay

## 2013-11-18 ENCOUNTER — Ambulatory Visit (INDEPENDENT_AMBULATORY_CARE_PROVIDER_SITE_OTHER): Payer: 59 | Admitting: Family Medicine

## 2013-11-18 ENCOUNTER — Encounter: Payer: Self-pay | Admitting: Family Medicine

## 2013-11-18 VITALS — BP 90/60 | HR 76 | Temp 98.2°F | Ht 72.0 in | Wt 170.0 lb

## 2013-11-18 DIAGNOSIS — Z0000 Encounter for general adult medical examination without abnormal findings: Secondary | ICD-10-CM

## 2013-11-18 DIAGNOSIS — IMO0001 Reserved for inherently not codable concepts without codable children: Secondary | ICD-10-CM

## 2013-11-18 LAB — TSH: TSH: 0.98 u[IU]/mL (ref 0.35–4.50)

## 2013-11-18 LAB — COMPREHENSIVE METABOLIC PANEL
ALK PHOS: 54 U/L (ref 39–117)
ALT: 21 U/L (ref 0–53)
AST: 28 U/L (ref 0–37)
Albumin: 4 g/dL (ref 3.5–5.2)
BILIRUBIN TOTAL: 0.7 mg/dL (ref 0.2–1.2)
BUN: 28 mg/dL — AB (ref 6–23)
CO2: 30 mEq/L (ref 19–32)
Calcium: 9.4 mg/dL (ref 8.4–10.5)
Chloride: 106 mEq/L (ref 96–112)
Creatinine, Ser: 1.3 mg/dL (ref 0.4–1.5)
GFR: 62.81 mL/min (ref >60.00)
GLUCOSE: 97 mg/dL (ref 70–99)
Potassium: 4 mEq/L (ref 3.5–5.1)
SODIUM: 143 meq/L (ref 135–145)
Total Protein: 6.9 g/dL (ref 6.0–8.3)

## 2013-11-18 LAB — POCT URINALYSIS DIPSTICK
Bilirubin, UA: NEGATIVE
Glucose, UA: NEGATIVE
Ketones, UA: NEGATIVE
Leukocytes, UA: NEGATIVE
NITRITE UA: NEGATIVE
RBC UA: NEGATIVE
Spec Grav, UA: 1.02
Urobilinogen, UA: 0.2
pH, UA: 7

## 2013-11-18 LAB — LIPID PANEL
CHOLESTEROL: 202 mg/dL — AB (ref 0–200)
HDL: 60.1 mg/dL (ref >39.00)
LDL CALC: 132 mg/dL — AB (ref 0–99)
NonHDL: 141.9
TRIGLYCERIDES: 51 mg/dL (ref 0.0–149.0)
Total CHOL/HDL Ratio: 3
VLDL: 10.2 mg/dL (ref 0.0–40.0)

## 2013-11-18 LAB — CBC
HEMATOCRIT: 41.2 % (ref 39.0–52.0)
Hemoglobin: 13.5 g/dL (ref 13.0–17.0)
MCHC: 32.8 g/dL (ref 30.0–36.0)
MCV: 94.5 fl (ref 78.0–100.0)
PLATELETS: 221 10*3/uL (ref 150.0–400.0)
RBC: 4.36 Mil/uL (ref 4.22–5.81)
RDW: 14.5 % (ref 11.5–15.5)
WBC: 4.8 10*3/uL (ref 4.0–10.5)

## 2013-11-18 LAB — PSA: PSA: 1.36 ng/mL (ref 0.10–4.00)

## 2013-11-18 MED ORDER — MELOXICAM 15 MG PO TABS: 15.0000 mg | ORAL_TABLET | Freq: Every day | ORAL | Status: DC

## 2013-11-18 NOTE — Progress Notes (Signed)
Alexander Reddish, MD Phone: [phone]  Subjective:  Patient presents today to establish care with me as their new primary care provider and for annual physical. He comes fasting today. He also sees Dr. Carolin Sicks. Patient was formerly a patient of Dr. Leanne Chang. Chief complaint-noted.   History of high cholesterol when weight was higher. Lost 30 lbs and cut saturated fats and cholesterol has come down. Working out with Clinical research associate at [employer], run, bike 3 days a week for an hour. Thinks he is doing well with diet. Lots of vegetables, little fruit. Patient overall is doing well.   ROS- right shoulder pain, arthritis, left elbow pain (started when lifting weights). Full ROS completed and negative specifically with no chest pain or shortness of breath. Some soreness in AM. No lightheadedness/dizziness based on BP.   The following were reviewed and entered/updated in epic: Past Medical History  Diagnosis Date  . Anxiety   . Detached retina     April 2012  . Depression    Patient Active Problem List   Diagnosis Date Noted  . ANXIETY 10/22/2006   Past Surgical History  Procedure Laterality Date  . Retinal detachment repair w/ scleral buckle le  04/2010  . Deep neck lymph node biopsy / excision      rt neck  . Appendectomy    . Fracture left ankle    . Colonoscopy  07/14/2010    hemorrhoids    Family History  Problem Relation Age of Onset  . Heart disease Mother     heart valve   . Heart disease Father     late 66s, nonsmoker, chol over 300  . Prostate cancer Father     late 67s  . Hypertension Father   . Arthritis Brother     psoriatic    Medications- reviewed and updated Current Outpatient Prescriptions  Medication Sig Dispense Refill  . aspirin 81 MG tablet Take 81 mg by mouth daily.      . clorazepate (TRANXENE) 3.75 MG tablet Take 3.75 mg by mouth 2 (two) times daily as needed for anxiety.    Marland Kitchen FLUoxetine (PROZAC) 20 MG tablet Take 20 mg by mouth daily.    Marland Kitchen gabapentin  (NEURONTIN) 400 MG capsule Take 400 mg by mouth 2 (two) times daily.    . Multiple Vitamin (MULTIVITAMIN) tablet Take 1 tablet by mouth daily.       No current facility-administered medications for this visit.    Allergies-reviewed and updated No Known Allergies  History   Social History  . Marital Status: Married    Spouse Name: Alexander Gonzales    Number of Children: Alexander Gonzales  . Years of Education: Alexander Gonzales   Social History Main Topics  . Smoking status: Former Smoker    Types: Cigarettes    Quit date: 05/10/1978  . Smokeless tobacco: None     Comment: random cigarette with beer in teenage years, very rare cigar once in 3 months  . Alcohol Use: 2.4 oz/week    4 Not specified per week     Comment: occasional alcohol intake, glass of wine 3-4 days a week  . Drug Use: No  . Sexual Activity: None   Other Topics Concern  . None   Social History Narrative   Married Jan 1984. 2 children - son and daughter. 2 grandkids.       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       Hobbies: exercise, flips houses with nephew, bought  3 rentals this year.    Watches Scandal with wife.           ROS--See HPI   Objective: BP 90/60 mmHg  Pulse 76  Temp(Src) 98.2 F (36.8 C)  Ht 6' (1.829 m)  Wt 170 lb (77.111 kg)  BMI 23.05 kg/m2 Gen: NAD, resting comfortably on table HEENT: Mucous membranes are moist. Oropharynx normal. A few filled caries, teeth in good shae.  Neck: no thyromegaly or lymphadenopathy CV: RRR no murmurs rubs or gallops Lungs: CTAB no crackles, wheeze, rhonchi Abdomen: soft/nontender/nondistended/normal bowel sounds. No rebound or guarding.  Ext: no edema, 2+ PT pulses Skin: warm, dry, full trunk and arm exam performed with no premalignant or malignant lesions identified Rectal: no prostate enlargement or asymetry or nodules Neuro: grossly normal, moves all extremities, PERRLA  Assessment/Plan:  53 y.o. male presenting for annual  physical.  Health Maintenance counseling: 1. Anticipatory guidance: Patient counseled regarding regular dental exams, wearing seatbelts, sunscreen use.  2. Risk factor reduction:  Advised patient of need for regular exercise and diet rich and fruits and vegetables to reduce risk of heart attack and stroke.  3. Immunizations/screenings/ancillary studies - up to date. Thanks for getting your flu shot.   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 Trial mobic x 10 days to calm down inflammation. Avoid painful exercises. Could use formal PT if needed. Consider sports medicine.

## 2013-11-18 NOTE — Patient Instructions (Signed)
53 y.o. male presenting for annual physical.  Health Maintenance counseling: 1. Anticipatory guidance: Patient counseled regarding regular dental exams, wearing seatbelts, sunscreen use.  2. Risk factor reduction:  Advised patient of need for regular exercise and diet rich and fruits and vegetables to reduce risk of heart attack and stroke.  3. Immunizations/screenings/ancillary studies - up to date. Thanks for getting your flu shot.   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 Trial mobic x 10 days to calm down inflammation while taking it easy on any exercise that causes pain (can definitely continue core and lower body).

## 2014-11-23 ENCOUNTER — Other Ambulatory Visit (INDEPENDENT_AMBULATORY_CARE_PROVIDER_SITE_OTHER): Payer: 59

## 2014-11-23 DIAGNOSIS — Z0000 Encounter for general adult medical examination without abnormal findings: Secondary | ICD-10-CM | POA: Diagnosis not present

## 2014-11-23 LAB — LIPID PANEL
CHOL/HDL RATIO: 3
Cholesterol: 186 mg/dL (ref 0–200)
HDL: 57.5 mg/dL (ref >39.00)
LDL Cholesterol: 111 mg/dL — ABNORMAL HIGH (ref 0–99)
NONHDL: 128
TRIGLYCERIDES: 84 mg/dL (ref 0.0–149.0)
VLDL: 16.8 mg/dL (ref 0.0–40.0)

## 2014-11-23 LAB — HEPATIC FUNCTION PANEL
ALT: 12 U/L (ref 0–53)
AST: 17 U/L (ref 0–37)
Albumin: 4.2 g/dL (ref 3.5–5.2)
Alkaline Phosphatase: 49 U/L (ref 39–117)
BILIRUBIN TOTAL: 0.4 mg/dL (ref 0.2–1.2)
Bilirubin, Direct: 0.1 mg/dL (ref 0.0–0.3)
Total Protein: 7.1 g/dL (ref 6.0–8.3)

## 2014-11-23 LAB — BASIC METABOLIC PANEL
BUN: 19 mg/dL (ref 6–23)
CALCIUM: 9.8 mg/dL (ref 8.4–10.5)
CHLORIDE: 107 meq/L (ref 96–112)
CO2: 31 mEq/L (ref 19–32)
CREATININE: 1.18 mg/dL (ref 0.40–1.50)
GFR: 68.11 mL/min (ref >60.00)
Glucose, Bld: 92 mg/dL (ref 70–99)
Potassium: 4.3 mEq/L (ref 3.5–5.1)
Sodium: 147 mEq/L — ABNORMAL HIGH (ref 135–145)

## 2014-11-23 LAB — TSH: TSH: 1.39 u[IU]/mL (ref 0.35–4.50)

## 2014-11-23 LAB — CBC WITH DIFFERENTIAL/PLATELET
BASOS PCT: 0.5 % (ref 0.0–3.0)
Basophils Absolute: 0 10*3/uL (ref 0.0–0.1)
EOS PCT: 1 % (ref 0.0–5.0)
Eosinophils Absolute: 0.1 10*3/uL (ref 0.0–0.7)
HCT: 44 % (ref 39.0–52.0)
HEMOGLOBIN: 14.4 g/dL (ref 13.0–17.0)
LYMPHS ABS: 1.2 10*3/uL (ref 0.7–4.0)
Lymphocytes Relative: 21.8 % (ref 12.0–46.0)
MCHC: 32.9 g/dL (ref 30.0–36.0)
MCV: 96.1 fl (ref 78.0–100.0)
MONO ABS: 0.7 10*3/uL (ref 0.1–1.0)
Monocytes Relative: 11.9 % (ref 3.0–12.0)
Neutro Abs: 3.6 10*3/uL (ref 1.4–7.7)
Neutrophils Relative %: 64.8 % (ref 43.0–77.0)
Platelets: 221 10*3/uL (ref 150.0–400.0)
RBC: 4.58 Mil/uL (ref 4.22–5.81)
RDW: 14.3 % (ref 11.5–15.5)
WBC: 5.5 10*3/uL (ref 4.0–10.5)

## 2014-11-23 LAB — POCT URINALYSIS DIPSTICK
BILIRUBIN UA: NEGATIVE
Glucose, UA: NEGATIVE
KETONES UA: NEGATIVE
Leukocytes, UA: NEGATIVE
NITRITE UA: NEGATIVE
PH UA: 5.5
Protein, UA: NEGATIVE
RBC UA: NEGATIVE
SPEC GRAV UA: 1.025
Urobilinogen, UA: 0.2

## 2014-11-23 LAB — PSA: PSA: 1.64 ng/mL (ref 0.10–4.00)

## 2014-12-03 ENCOUNTER — Other Ambulatory Visit: Payer: Self-pay | Admitting: Family Medicine

## 2014-12-03 ENCOUNTER — Ambulatory Visit (INDEPENDENT_AMBULATORY_CARE_PROVIDER_SITE_OTHER): Payer: 59 | Admitting: Family Medicine

## 2014-12-03 ENCOUNTER — Encounter: Payer: Self-pay | Admitting: Family Medicine

## 2014-12-03 VITALS — BP 108/70 | HR 69 | Temp 98.5°F | Ht 72.0 in | Wt 164.0 lb

## 2014-12-03 DIAGNOSIS — Z20828 Contact with and (suspected) exposure to other viral communicable diseases: Secondary | ICD-10-CM

## 2014-12-03 DIAGNOSIS — Z0000 Encounter for general adult medical examination without abnormal findings: Secondary | ICD-10-CM

## 2014-12-03 NOTE — Progress Notes (Signed)
Alexander Reddish, MD Phone: [phone]  Subjective:  Patient presents today for their annual physical. Chief complaint-noted.  See problem oriented charting- ROS- full  review of systems was completed and negative except for: very high levels of stress due to marital stressors. Anxiety is higher than normal and following with psychiatry. Guilt levels higher as well. No SI/HI reported.   The following were reviewed and entered/updated in epic: Past Medical History  Diagnosis Date  . Anxiety   . Detached retina     April 2012  . Depression    Patient Active Problem List   Diagnosis Date Noted  . ANXIETY 10/22/2006   Past Surgical History  Procedure Laterality Date  . Retinal detachment repair w/ scleral buckle le  04/2010  . Deep neck lymph node biopsy / excision      rt neck  . Appendectomy    . Fracture left ankle    . Colonoscopy  07/14/2010    hemorrhoids    Family History  Problem Relation Age of Onset  . Heart disease Mother     heart valve   . Heart disease Father     late 41s, nonsmoker, chol over 300  . Prostate cancer Father     late 87s  . Hypertension Father   . Arthritis Brother     psoriatic    Medications- reviewed and updated Current Outpatient Prescriptions  Medication Sig Dispense Refill  . aspirin 81 MG tablet Take 81 mg by mouth daily.      . clorazepate (TRANXENE) 3.75 MG tablet Take 3.75 mg by mouth 2 (two) times daily as needed for anxiety.    Marland Kitchen FLUoxetine (PROZAC) 20 MG tablet Take 20 mg by mouth daily.    Marland Kitchen gabapentin (NEURONTIN) 400 MG capsule Take 400 mg by mouth 2 (two) times daily.    . Multiple Vitamin (MULTIVITAMIN) tablet Take 1 tablet by mouth daily.       No current facility-administered medications for this visit.    Allergies-reviewed and updated No Known Allergies  Social History   Social History  . Marital Status: Married    Spouse Name: N/A  . Number of Children: N/A  . Years of Education: N/A   Social History  Main Topics  . Smoking status: Former Smoker    Types: Cigarettes    Quit date: 05/10/1978  . Smokeless tobacco: None     Comment: random cigarette with beer in teenage years, very rare cigar once in 3 months  . Alcohol Use: 2.4 oz/week    4 Standard drinks or equivalent per week     Comment: occasional alcohol intake, glass of wine 3-4 days a week  . Drug Use: No  . Sexual Activity: Not Asked   Other Topics Concern  . None   Social History Narrative   Married Jan 1984. 2 children - son and daughter. 2 grandkids.       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       Hobbies: exercise, flips houses with nephew, bought 3 rentals this year.    Watches Sc[employer] with wife.           ROS--See HPI   Objective: BP 108/70 mmHg  Pulse 69  Temp(Src) 98.5 F (36.9 C)  Ht 6' (1.829 m)  Wt 164 lb (74.39 kg)  BMI 22.24 kg/m2 Gen: NAD, resting comfortably HEENT: Mucous membranes are moist. Oropharynx normal Neck: no thyromegaly CV: RRR no murmurs rubs or  gallops Lungs: CTAB no crackles, wheeze, rhonchi Abdomen: soft/nontender/nondistended/normal bowel sounds. No rebound or guarding.  Rectal: normal tone, normal size prostate, no masses or tenderness Ext: no edema Skin: warm, dry Neuro: grossly normal, moves all extremities, PERRLA  Assessment/Plan:  54 y.o. male presenting for annual physical.  Health Maintenance counseling: 1. Anticipatory guidance: Patient counseled regarding regular dental exams, eye exams, wearing seatbelts.  2. Risk factor reduction:  Advised patient of need for regular exercise and diet rich and fruits and vegetables to reduce risk of heart attack and stroke.  3. Immunizations/screenings/ancillary studies Health Maintenance Due  Topic Date Due  . Hepatitis C Screening - today 1960/11/18  . HIV Screening - decline 11/13/1974   4. Prostate cancer screening- low risk prostate exam but PSA trending up- repeat 1 year  as mild increase   Lab Results  Component Value Date   PSA 1.64 11/23/2014   PSA 1.36 11/18/2013   PSA 1.15 06/13/2012   5. Colon cancer screening - 07/14/10 with 10 year repeat 6. Skin cancer screening- denies worrisome lesions and full exam  Stress/anxiety S:High levels of stress due to marital issues. Lost father in law 6 months ago. He was very close to him. Wife shut herself off for many months. Patient with few male friends. Coworker who he has been close to that a friendship developed but has been hard on marriage. Also close to son but because of relationship at [facility] for son.  Continues to follow with psychiatry. Issue is no male support and male support is tough on marriage. Used to be in a church he had some male relationships but now transitioning.  A/P: continue psychiatry follow up. I am going to try to follow up with patient at least weekly over the next few weeks by phone as well. Patient states he needs to either quit job or dissolve friendship at work. Will touch base on topics next week (work friendship, wife relationship, son relationship, building male relationships) on cell.   1 month anxiety f/u. 1 year CPE.  Return precautions advised.   Orders Placed This Encounter  Procedures  . Hepatitis C antibody, reflex    [facility]

## 2014-12-03 NOTE — Patient Instructions (Signed)
Overall things look great physically  Would love to check in with you in a month to see how things are going from anxiety perspective. Also may touch base by phone if that's ok in coming months  Hepatitis C screening for baby boomer generation recommended  See you in a year for a physical

## 2014-12-04 LAB — HEPATITIS C ANTIBODY: HCV Ab: NEGATIVE

## 2015-02-28 ENCOUNTER — Ambulatory Visit (INDEPENDENT_AMBULATORY_CARE_PROVIDER_SITE_OTHER): Payer: 59 | Admitting: Family Medicine

## 2015-02-28 ENCOUNTER — Encounter: Payer: Self-pay | Admitting: Family Medicine

## 2015-02-28 VITALS — BP 110/60 | HR 80 | Temp 98.4°F | Wt 167.0 lb

## 2015-02-28 DIAGNOSIS — N529 Male erectile dysfunction, unspecified: Secondary | ICD-10-CM | POA: Diagnosis not present

## 2015-02-28 DIAGNOSIS — R3915 Urgency of urination: Secondary | ICD-10-CM

## 2015-02-28 DIAGNOSIS — F411 Generalized anxiety disorder: Secondary | ICD-10-CM

## 2015-02-28 DIAGNOSIS — R972 Elevated prostate specific antigen [PSA]: Secondary | ICD-10-CM

## 2015-02-28 DIAGNOSIS — R350 Frequency of micturition: Secondary | ICD-10-CM

## 2015-02-28 LAB — POC URINALSYSI DIPSTICK (AUTOMATED)
Bilirubin, UA: NEGATIVE
COLOR UA: NEGATIVE
Clarity, UA: NEGATIVE
Glucose, UA: NEGATIVE
Ketones, UA: NEGATIVE
LEUKOCYTES UA: NEGATIVE
NITRITE UA: NEGATIVE
PH UA: 6.5
RBC UA: NEGATIVE
Spec Grav, UA: >=1.03
UROBILINOGEN UA: 0.2

## 2015-02-28 LAB — PSA: PSA: 2.18 ng/mL (ref 0.10–4.00)

## 2015-02-28 MED ORDER — SILDENAFIL CITRATE 20 MG PO TABS
100.0000 mg | ORAL_TABLET | Freq: Every day | ORAL | Status: DC | PRN
Start: 2015-02-28 — End: 2016-01-05

## 2015-02-28 MED ORDER — SILDENAFIL CITRATE 100 MG PO TABS: 100.0000 mg | ORAL_TABLET | Freq: Every day | ORAL | Status: DC | PRN

## 2015-02-28 NOTE — Progress Notes (Signed)
Garret Reddish, MD  Subjective:  Alexander Gonzales is a 55 y.o. year old very pleasant male patient who presents for/with See problem oriented charting ROS- no fever, chills, nausea, vomiting, abnormal fatigue, bone pain  Past Medical History-  Patient Active Problem List   Diagnosis Date Noted  . ANXIETY 10/22/2006    Medications- reviewed and updated Current Outpatient Prescriptions  Medication Sig Dispense Refill  . aspirin 81 MG tablet Take 81 mg by mouth daily.      . clorazepate (TRANXENE) 3.75 MG tablet Take 3.75 mg by mouth 2 (two) times daily as needed for anxiety.    Marland Kitchen FLUoxetine (PROZAC) 20 MG tablet Take 20 mg by mouth daily.    Marland Kitchen gabapentin (NEURONTIN) 400 MG capsule Take 400 mg by mouth 2 (two) times daily.    . Multiple Vitamin (MULTIVITAMIN) tablet Take 1 tablet by mouth daily.       Objective: BP 110/60 mmHg  Pulse 80  Temp(Src) 98.4 F (36.9 C)  Wt 167 lb (75.751 kg) Gen: NAD, resting comfortably CV: RRR no murmurs rubs or gallops Lungs: CTAB no crackles, wheeze, rhonchi Abdomen: soft/nontender/nondistended/normal bowel sounds. No rebound or guarding.  Rectal: normal tone, normal size prostate unchanged from november, no masses or tenderness Ext: no edema Skin: warm, dry Neuro: grossly normal, moves all extremities  Results for orders placed or performed in visit on 02/28/15 (from the past 24 hour(s))  POCT Urinalysis Dipstick (Automated)     Status: None   Collection Time: 02/28/15  8:22 AM  Result Value Ref Range   Color, UA neg    Clarity, UA neg    Glucose, UA neg    Bilirubin, UA neg    Ketones, UA neg    Spec Grav, UA >=1.030    Blood, UA neg    pH, UA 6.5    Protein, UA trace    Urobilinogen, UA 0.2    Nitrite, UA neg    Leukocytes, UA Negative Negative  PSA     Status: None   Collection Time: 02/28/15  8:53 AM  Result Value Ref Range   PSA 2.18 0.10 - 4.00 ng/mL    Assessment/Plan:  Slow to start urinary stream/urinary urgency S:  Slower start to urinating- for about last month and not worsening in that time. Normal stream once he gets started urinating. The more water or coffee he drinks the less of an issue he has. Some urgency more than normal. Sleeping through the night without nocturia. Denies polyuria.  Lab Results  Component Value Date   PSA 1.64 11/23/2014   PSA 1.36 11/18/2013   PSA 1.15 06/13/2012  A/P: Already had an appointment in April scheduled with urology. Urine is normal. He is going to try to increase urine intake given specific gravity findings. COncern is the rising PSA value though may be transient- will be repeated by urology at visit within 2 months Lab Results  Component Value Date   PSA 2.18 02/28/2015   PSA 1.64 11/23/2014   PSA 1.36 11/18/2013   Anxiety state Stress/anxiety related to prior noted marital issues-Suspect this is contributing strongly to ED issues. Counseled on this. Wife was present and counseled her as well.   Erectile dysfunction S: difficult to maintain erection over last few months. There has been marital strife but improving. May have had some issue prior to this but not as pronounced.  A/P: suspect some likely vasculogenic effect but coupled with marital strife/psych issues has now made maintaining very  difficult. Trial viagra if affordable with coupon- if not printed as revatio to be tried at his pharmacy or [facility].    Return precautions advised.   Orders Placed This Encounter  Procedures  . PSA  . Ambulatory referral to Urology    Referral Priority:  Routine    Referral Type:  Consultation    Referral Reason:  Specialty Services Required    Requested Specialty:  Urology    Number of Visits Requested:  1  . POCT Urinalysis Dipstick (Automated)   The duration of face-to-face time during this visit was 30 minutes. Greater than 50% of this time was spent in counseling, explanation of diagnosis, planning of further management, and/or coordination of care.

## 2015-02-28 NOTE — Assessment & Plan Note (Signed)
Stress/anxiety related to prior noted marital issues-Suspect this is contributing strongly to ED issues. Counseled on this. Wife was present and counseled her as well.

## 2015-02-28 NOTE — Patient Instructions (Signed)
Check PSA Suspect issues are primarily related to hydration  For erectile dysfunction Sent in Viagra but may not be covered If not- also printed sildenafil which may be cheaper at your pharmacy but definitely should be cheaper at [facility]- would call them before driving over or faxing

## 2015-02-28 NOTE — Assessment & Plan Note (Signed)
S: difficult to maintain erection over last few months. There has been marital strife but improving. May have had some issue prior to this but not as pronounced.  A/P: suspect some likely vasculogenic effect but coupled with marital strife/psych issues has now made maintaining very difficult. Trial viagra if affordable with coupon- if not printed as revatio to be tried at his pharmacy or [facility].

## 2015-05-17 ENCOUNTER — Encounter: Payer: Self-pay | Admitting: Family Medicine

## 2015-07-08 ENCOUNTER — Other Ambulatory Visit (HOSPITAL_COMMUNITY): Payer: Self-pay | Admitting: Psychiatry

## 2015-12-27 ENCOUNTER — Other Ambulatory Visit: Payer: 59

## 2015-12-28 ENCOUNTER — Other Ambulatory Visit (INDEPENDENT_AMBULATORY_CARE_PROVIDER_SITE_OTHER): Payer: 59

## 2015-12-28 DIAGNOSIS — Z0000 Encounter for general adult medical examination without abnormal findings: Secondary | ICD-10-CM | POA: Diagnosis not present

## 2015-12-28 LAB — CBC WITH DIFFERENTIAL/PLATELET
Basophils Absolute: 0 10*3/uL (ref 0.0–0.1)
Basophils Relative: 0.3 % (ref 0.0–3.0)
EOS PCT: 0.7 % (ref 0.0–5.0)
Eosinophils Absolute: 0 10*3/uL (ref 0.0–0.7)
HCT: 40.9 % (ref 39.0–52.0)
Hemoglobin: 13.8 g/dL (ref 13.0–17.0)
LYMPHS PCT: 35.4 % (ref 12.0–46.0)
Lymphs Abs: 1.9 10*3/uL (ref 0.7–4.0)
MCHC: 33.8 g/dL (ref 30.0–36.0)
MCV: 94.3 fl (ref 78.0–100.0)
MONO ABS: 0.5 10*3/uL (ref 0.1–1.0)
Monocytes Relative: 9.9 % (ref 3.0–12.0)
Neutro Abs: 2.8 10*3/uL (ref 1.4–7.7)
Neutrophils Relative %: 53.7 % (ref 43.0–77.0)
Platelets: 224 10*3/uL (ref 150.0–400.0)
RBC: 4.34 Mil/uL (ref 4.22–5.81)
RDW: 14 % (ref 11.5–15.5)
WBC: 5.2 10*3/uL (ref 4.0–10.5)

## 2015-12-28 LAB — BASIC METABOLIC PANEL
BUN: 20 mg/dL (ref 6–23)
CHLORIDE: 109 meq/L (ref 96–112)
CO2: 30 meq/L (ref 19–32)
CREATININE: 1.1 mg/dL (ref 0.40–1.50)
Calcium: 9.2 mg/dL (ref 8.4–10.5)
GFR: 73.56 mL/min (ref >60.00)
Glucose, Bld: 97 mg/dL (ref 70–99)
POTASSIUM: 4.7 meq/L (ref 3.5–5.1)
SODIUM: 146 meq/L — AB (ref 135–145)

## 2015-12-28 LAB — POC URINALSYSI DIPSTICK (AUTOMATED)
BILIRUBIN UA: NEGATIVE
Glucose, UA: NEGATIVE
KETONES UA: NEGATIVE
Leukocytes, UA: NEGATIVE
Nitrite, UA: NEGATIVE
PH UA: 5.5
Protein, UA: NEGATIVE
RBC UA: NEGATIVE
Spec Grav, UA: 1.02
Urobilinogen, UA: 0.2

## 2015-12-28 LAB — HEPATIC FUNCTION PANEL
ALT: 20 U/L (ref 0–53)
AST: 23 U/L (ref 0–37)
Albumin: 4 g/dL (ref 3.5–5.2)
Alkaline Phosphatase: 47 U/L (ref 39–117)
BILIRUBIN DIRECT: 0.1 mg/dL (ref 0.0–0.3)
BILIRUBIN TOTAL: 0.4 mg/dL (ref 0.2–1.2)
Total Protein: 6.4 g/dL (ref 6.0–8.3)

## 2015-12-28 LAB — LIPID PANEL
CHOL/HDL RATIO: 3
CHOLESTEROL: 209 mg/dL — AB (ref 0–200)
HDL: 68.1 mg/dL (ref >39.00)
LDL CALC: 128 mg/dL — AB (ref 0–99)
NonHDL: 140.89
Triglycerides: 62 mg/dL (ref 0.0–149.0)
VLDL: 12.4 mg/dL (ref 0.0–40.0)

## 2015-12-28 LAB — TSH: TSH: 1.54 u[IU]/mL (ref 0.35–4.50)

## 2015-12-28 LAB — PSA: PSA: 1.82 ng/mL (ref 0.10–4.00)

## 2016-01-05 ENCOUNTER — Ambulatory Visit (INDEPENDENT_AMBULATORY_CARE_PROVIDER_SITE_OTHER): Payer: 59 | Admitting: Family Medicine

## 2016-01-05 ENCOUNTER — Encounter: Payer: Self-pay | Admitting: Family Medicine

## 2016-01-05 VITALS — BP 120/78 | HR 72 | Temp 98.3°F | Ht 71.5 in | Wt 168.0 lb

## 2016-01-05 DIAGNOSIS — Z0000 Encounter for general adult medical examination without abnormal findings: Secondary | ICD-10-CM | POA: Diagnosis not present

## 2016-01-05 NOTE — Patient Instructions (Signed)
You are doing great  Cholesterol up some through the holidays but no major concern

## 2016-01-05 NOTE — Progress Notes (Signed)
Phone: [phone]  Subjective:  Patient presents today for their annual physical. Chief complaint-noted.   See problem oriented charting- ROS- full  review of systems was completed and negative including No chest pain or shortness of breath. No headache or blurry vision.   The following were reviewed and entered/updated in epic: Past Medical History:  Diagnosis Date  . Anxiety   . Depression   . Detached retina    April 2012   Patient Active Problem List   Diagnosis Date Noted  . Erectile dysfunction 02/28/2015  . Anxiety state 10/22/2006   Past Surgical History:  Procedure Laterality Date  . APPENDECTOMY    . COLONOSCOPY  07/14/2010   hemorrhoids  . DEEP NECK LYMPH NODE BIOPSY / EXCISION     rt neck  . fracture left ankle    . RETINAL DETACHMENT REPAIR W/ SCLERAL BUCKLE LE  04/2010    Family History  Problem Relation Age of Onset  . Heart disease Mother     heart valve   . Heart disease Father     late 8s, nonsmoker, chol over 300  . Prostate cancer Father     late 34s  . Hypertension Father   . Arthritis Brother     psoriatic    Medications- reviewed and updated Current Outpatient Prescriptions  Medication Sig Dispense Refill  . aspirin 81 MG tablet Take 81 mg by mouth daily.      . clorazepate (TRANXENE) 3.75 MG tablet Take 3.75 mg by mouth 2 (two) times daily as needed for anxiety.    Marland Kitchen FLUoxetine (PROZAC) 20 MG tablet Take 20 mg by mouth daily.    Marland Kitchen gabapentin (NEURONTIN) 400 MG capsule Take 400 mg by mouth 2 (two) times daily.    . Multiple Vitamin (MULTIVITAMIN) tablet Take 1 tablet by mouth daily.       No current facility-administered medications for this visit.     Allergies-reviewed and updated No Known Allergies  Social History   Social History  . Marital status: Married    Spouse name: N/A  . Number of children: N/A  . Years of education: N/A   Social History Main Topics  . Smoking status: Former Smoker    Types: Cigarettes   Quit date: 05/10/1978  . Smokeless tobacco: None     Comment: random cigarette with beer in teenage years, very rare cigar once in 3 months  . Alcohol use 2.4 oz/week    4 Standard drinks or equivalent per week     Comment: occasional alcohol intake, glass of wine 3-4 days a week  . Drug use: No  . Sexual activity: Not Asked   Other Topics Concern  . None   Social History Narrative   Married Jan 1984. 2 children - son and daughter. 2 grandkids.       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       Hobbies: exercise, flips houses with nephew, bought 3 rentals this year.    Watches Scandal with wife.           Objective: BP 120/78 (BP Location: Left Arm, Patient Position: Sitting, Cuff Size: Large)   Pulse 72   Temp 98.3 F (36.8 C) (Oral)   Ht 5' 11.5" (1.816 m)   Wt 168 lb (76.2 kg)   SpO2 95%   BMI 23.10 kg/m  Gen: NAD, resting comfortably HEENT: Mucous membranes are moist. Oropharynx normal Neck: no thyromegaly CV: RRR no murmurs  rubs or gallops Lungs: CTAB no crackles, wheeze, rhonchi Abdomen: soft/nontender/nondistended/normal bowel sounds. No rebound or guarding.  Ext: no edema Skin: warm, dry Neuro: grossly normal, moves all extremities, PERRLA  Rectal deferred as just done at urology  Assessment/Plan:  56 y.o. male presenting for annual physical.  Health Maintenance counseling: 1. Anticipatory guidance: Patient counseled regarding regular dental exams, eye exams, wearing seatbelts.  2. Risk factor reduction:  Advised patient of need for regular exercise and diet rich and fruits and vegetables to reduce risk of heart attack and stroke. At least twice a week, sometimes more. Weight reasonable and stable.  3. Immunizations/screenings/ancillary studies Immunization History  Administered Date(s) Administered  . Influenza Whole 11/01/2008  . Influenza-Unspecified 10/15/2013, 11/18/2014, 10/19/2015  . Td 01/02/2003  .  Tdap 02/18/2007  4. Prostate cancer screening- low risk trend for prostate cancer, recent rectal exam with urology- with trend they were less concerned about prostate cancer Lab Results  Component Value Date   PSA 1.82 12/28/2015   PSA 2.18 02/28/2015   PSA 1.64 11/23/2014   5. Colon cancer screening - 07/14/10 with 10 year repeat planned 6. Skin cancer prevention- advised regular suncreen  Status of chronic or acute concerns  Anxiety- on prozac 20mg  regularly. Has clorazepate for prn use. Also on gabapentin through psychiatry   Sildenafil as needed for ED  Mild hyperlipidemia but great HDL- no need for treatment at this time. 4.4% 10 year risk  1 year CPE   Return precautions advised.   Garret Reddish, MD

## 2016-01-05 NOTE — Progress Notes (Signed)
Pre visit review using our clinic review tool, if applicable. No additional management support is needed unless otherwise documented below in the visit note. 

## 2016-06-21 DIAGNOSIS — H43811 Vitreous degeneration, right eye: Secondary | ICD-10-CM | POA: Diagnosis not present

## 2016-06-21 DIAGNOSIS — H31002 Unspecified chorioretinal scars, left eye: Secondary | ICD-10-CM | POA: Diagnosis not present

## 2016-06-21 DIAGNOSIS — Z961 Presence of intraocular lens: Secondary | ICD-10-CM | POA: Diagnosis not present

## 2017-05-23 ENCOUNTER — Other Ambulatory Visit: Payer: 59

## 2017-05-23 ENCOUNTER — Encounter: Payer: Self-pay | Admitting: Family Medicine

## 2017-05-23 ENCOUNTER — Ambulatory Visit (INDEPENDENT_AMBULATORY_CARE_PROVIDER_SITE_OTHER): Payer: 59 | Admitting: Family Medicine

## 2017-05-23 VITALS — BP 106/64 | HR 70 | Temp 98.1°F | Ht 71.5 in | Wt 176.0 lb

## 2017-05-23 DIAGNOSIS — Z125 Encounter for screening for malignant neoplasm of prostate: Secondary | ICD-10-CM | POA: Diagnosis not present

## 2017-05-23 DIAGNOSIS — Z0000 Encounter for general adult medical examination without abnormal findings: Secondary | ICD-10-CM | POA: Diagnosis not present

## 2017-05-23 DIAGNOSIS — E785 Hyperlipidemia, unspecified: Secondary | ICD-10-CM

## 2017-05-23 DIAGNOSIS — Z23 Encounter for immunization: Secondary | ICD-10-CM

## 2017-05-23 LAB — LIPID PANEL
CHOL/HDL RATIO: 3
Cholesterol: 207 mg/dL — ABNORMAL HIGH (ref 0–200)
HDL: 70.3 mg/dL (ref >39.00)
LDL Cholesterol: 124 mg/dL — ABNORMAL HIGH (ref 0–99)
NonHDL: 136.3
TRIGLYCERIDES: 60 mg/dL (ref 0.0–149.0)
VLDL: 12 mg/dL (ref 0.0–40.0)

## 2017-05-23 LAB — COMPREHENSIVE METABOLIC PANEL
ALBUMIN: 4.2 g/dL (ref 3.5–5.2)
ALT: 15 U/L (ref 0–53)
AST: 20 U/L (ref 0–37)
Alkaline Phosphatase: 49 U/L (ref 39–117)
BILIRUBIN TOTAL: 0.7 mg/dL (ref 0.2–1.2)
BUN: 28 mg/dL — ABNORMAL HIGH (ref 6–23)
CALCIUM: 9.7 mg/dL (ref 8.4–10.5)
CHLORIDE: 104 meq/L (ref 96–112)
CO2: 30 mEq/L (ref 19–32)
Creatinine, Ser: 1.23 mg/dL (ref 0.40–1.50)
GFR: 64.34 mL/min (ref >60.00)
Glucose, Bld: 85 mg/dL (ref 70–99)
Potassium: 4.2 mEq/L (ref 3.5–5.1)
Sodium: 141 mEq/L (ref 135–145)
Total Protein: 6.9 g/dL (ref 6.0–8.3)

## 2017-05-23 LAB — CBC
HEMATOCRIT: 42.1 % (ref 39.0–52.0)
HEMOGLOBIN: 14.3 g/dL (ref 13.0–17.0)
MCHC: 33.8 g/dL (ref 30.0–36.0)
MCV: 96.8 fl (ref 78.0–100.0)
PLATELETS: 220 10*3/uL (ref 150.0–400.0)
RBC: 4.35 Mil/uL (ref 4.22–5.81)
RDW: 14 % (ref 11.5–15.5)
WBC: 4.7 10*3/uL (ref 4.0–10.5)

## 2017-05-23 LAB — PSA: PSA: 2.1 ng/mL (ref 0.10–4.00)

## 2017-05-23 NOTE — Progress Notes (Signed)
Your CBC was normal (blood counts, infection fighting cells, platelets). Your CMET was normal (kidney, liver, and electrolytes, blood sugar) except for the mildest of dehydration- common when folks are fasting for labs.  Your PSA is up slightly but similar to 2 years ago- I thin we should continue to watch this yearly.  Your cholesterol has actually improved slightly from last year. I would not start cholesterol medicine at this level.  More detail if desired.  Your 10 year risk of heart attack or stroke based on the ASCVD risk calculation is 3.8%. This # uses your age, blood pressure, cholesterol, and current medications to determine risk of heart attack or stroke.  No Cholesterol medicine is recommended under 5%. Between 5-7.5% it is recommended to consider cholesterol medicine and above 7.5 % it it is recommended to start statin. I usually use at least a 10-12% cut off to suggest use as I think calculator slightly overestimates risk.

## 2017-05-23 NOTE — Patient Instructions (Addendum)
I love the workout routine! Keep it up  We discussed his daily aspirin use. If ascvd risk (heart attack and stroke risk) is below 10%- we agreed to stop aspirin.   Please stop by lab before you go

## 2017-05-23 NOTE — Progress Notes (Signed)
Phone: [phone]  Subjective:  Patient presents today for their annual physical. Chief complaint-noted.   See problem oriented charting- ROS- full  review of systems was completed and negative including No chest pain or shortness of breath. No headache or blurry vision.   The following were reviewed and entered/updated in epic: Past Medical History:  Diagnosis Date  . Anxiety   . Depression   . Detached retina    April 2012   Patient Active Problem List   Diagnosis Date Noted  . Erectile dysfunction 02/28/2015  . Anxiety state 10/22/2006   Past Surgical History:  Procedure Laterality Date  . APPENDECTOMY    . COLONOSCOPY  07/14/2010   hemorrhoids  . DEEP NECK LYMPH NODE BIOPSY / EXCISION     rt neck  . fracture left ankle    . RETINAL DETACHMENT REPAIR W/ SCLERAL BUCKLE LE  04/2010    Family History  Problem Relation Age of Onset  . Heart disease Mother        heart valve   . Heart disease Father        late 58s, nonsmoker, chol over 300  . Prostate cancer Father        late 79s  . Hypertension Father   . Arthritis Brother        psoriatic    Medications- reviewed and updated Current Outpatient Medications  Medication Sig Dispense Refill  . aspirin 81 MG tablet Take 81 mg by mouth daily.      . clorazepate (TRANXENE) 3.75 MG tablet Take 3.75 mg by mouth 2 (two) times daily as needed for anxiety.    Marland Kitchen FLUoxetine (PROZAC) 20 MG tablet Take 20 mg by mouth daily.    Marland Kitchen gabapentin (NEURONTIN) 400 MG capsule Take 400 mg by mouth 2 (two) times daily.    . Multiple Vitamin (MULTIVITAMIN) tablet Take 1 tablet by mouth daily.       No current facility-administered medications for this visit.     Allergies-reviewed and updated No Known Allergies  Social History   Social History Narrative   Married Jan 1984. 2 children - son and daughter. 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       Hobbies: exercise, flips houses with nephew    Objective: BP 106/64 (BP Location: Left Arm, Patient Position: Sitting, Cuff Size: Large)   Pulse 70   Temp 98.1 F (36.7 C) (Oral)   Ht 5' 11.5" (1.816 m)   Wt 176 lb (79.8 kg)   SpO2 95%   BMI 24.20 kg/m  Gen: NAD, resting comfortably HEENT: Mucous membranes are moist. Oropharynx normal Neck: no thyromegaly CV: RRR no murmurs rubs or gallops Lungs: CTAB no crackles, wheeze, rhonchi Abdomen: soft/nontender/nondistended/normal bowel sounds. No rebound or guarding.  Ext: no edema Skin: warm, dry Neuro: grossly normal, moves all extremities, PERRLA Rectal: normal tone, normal sized prostate (perhaps large normal), no masses or tenderness  Assessment/Plan:  57 y.o. male presenting for annual physical.  Health Maintenance counseling: 1. Anticipatory guidance: Patient counseled regarding regular dental exams -q6 months, eye exams -yearly, wearing seatbelts.  2. Risk factor reduction:  Advised patient of need for regular exercise and diet rich and fruits and vegetables to reduce risk of heart attack and stroke. Exercise- working with trainer twie a week and at least going by himself once a week plus street bike once  a week- thinks he has put on some muscle mass. Diet-states hasnt paid as much attention to saturated fats. .  Wt Readings from Last 3 Encounters:  05/23/17 176 lb (79.8 kg)  01/05/16 168 lb (76.2 kg)  02/28/15 167 lb (75.8 kg)  3. Immunizations/screenings/ancillary studies-discussed option of getting Shingrix at the pharmacy as we are out- he would like to get this as had in eye distribution before.  Tdap today  Immunization History  Administered Date(s) Administered  . Influenza Whole 11/01/2008  . Influenza-Unspecified 10/15/2013, 11/18/2014, 10/19/2015  . Td 01/02/2003  . Tdap 02/18/2007, 05/23/2017  4. Prostate cancer screening- low risk smooth rectal exam. prior low risk treatment for prostate cancer.  Update psa Lab Results  Component Value Date   PSA 1.82 12/28/2015   PSA 2.18 02/28/2015   PSA 1.64 11/23/2014   5. Colon cancer screening - 07/14/2010 with 10-year repeat planned . 6. Skin cancer screening- no dermatologist. advised regular sunscreen use. Denies worrisome, changing, or new skin lesions.   Status of chronic or acute concerns   through psychiatry-for anxiety-Remains on 20 mg Prozac daily.  He has clorazepate for as needed use.  He also is on gabapentin.  Denies depressed mood. No SI.   Sildenafil for as needed erectile dysfunction. He gets through urology  Mild hyperlipidemia- has had excellent HDL in the past.  We will update his lipids in 10-year ASCVD risk  We discussed his daily aspirin use. If ascvd risk is below 10%- we agreed to stop aspirin.   Return in about 1 year (around 05/24/2018) for physical.  Lab/Order associations: Preventative health care - Plan: CBC, Comprehensive metabolic panel, Lipid panel, PSA  Need for prophylactic vaccination with combined diphtheria-tetanus-pertussis (DTP) vaccine - Plan: Tdap vaccine greater than or equal to 7yo IM  Hyperlipidemia, unspecified hyperlipidemia type - Plan: CBC, Comprehensive metabolic panel, Lipid panel  Screening for prostate cancer - Plan: PSA  Return precautions advised.  Garret Reddish, MD

## 2017-05-24 NOTE — Addendum Note (Signed)
Addended by: Lyndle Herrlich on: 05/24/2017 03:32 PM   Modules accepted: Orders

## 2017-06-20 DIAGNOSIS — H43811 Vitreous degeneration, right eye: Secondary | ICD-10-CM | POA: Diagnosis not present

## 2017-06-20 DIAGNOSIS — H52203 Unspecified astigmatism, bilateral: Secondary | ICD-10-CM | POA: Diagnosis not present

## 2017-06-20 DIAGNOSIS — H31002 Unspecified chorioretinal scars, left eye: Secondary | ICD-10-CM | POA: Diagnosis not present

## 2017-07-02 ENCOUNTER — Ambulatory Visit: Payer: 59

## 2017-07-02 ENCOUNTER — Ambulatory Visit (INDEPENDENT_AMBULATORY_CARE_PROVIDER_SITE_OTHER): Payer: 59

## 2017-07-02 DIAGNOSIS — Z23 Encounter for immunization: Secondary | ICD-10-CM | POA: Diagnosis not present

## 2017-07-02 NOTE — Progress Notes (Signed)
Patient in today for first Shingrix vaccine. Administered in left deltoid. Patient tolerated well. VIS given. Discussed with patient that he may experience redness, swelling, and some soreness at injection site. Instructed patient that he could apply cold compress. He verbalized understanding

## 2017-09-05 ENCOUNTER — Ambulatory Visit: Payer: 59

## 2017-09-10 ENCOUNTER — Ambulatory Visit: Payer: 59

## 2017-09-12 ENCOUNTER — Ambulatory Visit (INDEPENDENT_AMBULATORY_CARE_PROVIDER_SITE_OTHER): Payer: 59

## 2017-09-12 DIAGNOSIS — Z23 Encounter for immunization: Secondary | ICD-10-CM

## 2017-09-12 NOTE — Progress Notes (Signed)
Per orders of Dr.Hunter , injection of Shingrix  given by Francella Solian. Patient tolerated injection well. Injection given in right deltoid with no issues. All immunizations updated in [place]

## 2017-12-17 DIAGNOSIS — R972 Elevated prostate specific antigen [PSA]: Secondary | ICD-10-CM | POA: Diagnosis not present

## 2018-01-23 DIAGNOSIS — H2511 Age-related nuclear cataract, right eye: Secondary | ICD-10-CM | POA: Diagnosis not present

## 2018-01-23 DIAGNOSIS — H43811 Vitreous degeneration, right eye: Secondary | ICD-10-CM | POA: Diagnosis not present

## 2018-01-23 DIAGNOSIS — H33012 Retinal detachment with single break, left eye: Secondary | ICD-10-CM | POA: Diagnosis not present

## 2018-01-23 DIAGNOSIS — H31009 Unspecified chorioretinal scars, unspecified eye: Secondary | ICD-10-CM | POA: Diagnosis not present

## 2018-03-18 DIAGNOSIS — L821 Other seborrheic keratosis: Secondary | ICD-10-CM | POA: Diagnosis not present

## 2018-03-18 DIAGNOSIS — D239 Other benign neoplasm of skin, unspecified: Secondary | ICD-10-CM | POA: Diagnosis not present

## 2018-05-26 ENCOUNTER — Encounter: Payer: 59 | Admitting: Family Medicine

## 2018-05-27 ENCOUNTER — Encounter: Payer: 59 | Admitting: Family Medicine

## 2018-05-27 ENCOUNTER — Encounter: Payer: Self-pay | Admitting: Family Medicine

## 2018-05-27 NOTE — Patient Instructions (Addendum)
Please stop by lab before you go If you do not have mychart- we will call you about results within 5 business days of [place] receiving them.  If you have mychart- we will send your results within 3 business days of [place] receiving them.  If abnormal or we want to clarify a result, we will call or mychart you to make sure you receive the message.  If you have questions or concerns or don't hear within 5-7 days, please send [place] a message or call us.    For nasal congestion-  1. Try neti pot or neil med sinus rinse for 2 weeks alone. Make sure to use distilled or previously boiled water.  2. If not effective, can try flonase for 2 weeks alone. 3.  If not effective- can try these two together for 2 weeks (flonase in AM, rinse at night)

## 2018-05-28 ENCOUNTER — Other Ambulatory Visit: Payer: Self-pay | Admitting: Physical Therapy

## 2018-05-28 ENCOUNTER — Ambulatory Visit (INDEPENDENT_AMBULATORY_CARE_PROVIDER_SITE_OTHER): Payer: 59 | Admitting: Family Medicine

## 2018-05-28 ENCOUNTER — Other Ambulatory Visit: Payer: Self-pay

## 2018-05-28 VITALS — BP 110/78 | HR 75 | Temp 98.7°F | Ht 71.25 in | Wt 175.0 lb

## 2018-05-28 DIAGNOSIS — Z0000 Encounter for general adult medical examination without abnormal findings: Secondary | ICD-10-CM | POA: Diagnosis not present

## 2018-05-28 DIAGNOSIS — Z125 Encounter for screening for malignant neoplasm of prostate: Secondary | ICD-10-CM | POA: Diagnosis not present

## 2018-05-28 DIAGNOSIS — E785 Hyperlipidemia, unspecified: Secondary | ICD-10-CM

## 2018-05-28 DIAGNOSIS — R972 Elevated prostate specific antigen [PSA]: Secondary | ICD-10-CM

## 2018-05-28 LAB — LIPID PANEL
Cholesterol: 228 mg/dL — ABNORMAL HIGH (ref 0–200)
HDL: 76.9 mg/dL (ref >39.00)
LDL Cholesterol: 138 mg/dL — ABNORMAL HIGH (ref 0–99)
NonHDL: 151.05
Total CHOL/HDL Ratio: 3
Triglycerides: 67 mg/dL (ref 0.0–149.0)
VLDL: 13.4 mg/dL (ref 0.0–40.0)

## 2018-05-28 LAB — COMPREHENSIVE METABOLIC PANEL
ALT: 19 U/L (ref 0–53)
AST: 24 U/L (ref 0–37)
Albumin: 4.3 g/dL (ref 3.5–5.2)
Alkaline Phosphatase: 55 U/L (ref 39–117)
BUN: 19 mg/dL (ref 6–23)
CO2: 30 mEq/L (ref 19–32)
Calcium: 9.7 mg/dL (ref 8.4–10.5)
Chloride: 105 mEq/L (ref 96–112)
Creatinine, Ser: 1.03 mg/dL (ref 0.40–1.50)
GFR: 74.03 mL/min (ref >60.00)
Glucose, Bld: 90 mg/dL (ref 70–99)
Potassium: 4.9 mEq/L (ref 3.5–5.1)
Sodium: 141 mEq/L (ref 135–145)
Total Bilirubin: 0.6 mg/dL (ref 0.2–1.2)
Total Protein: 7.1 g/dL (ref 6.0–8.3)

## 2018-05-28 LAB — CBC
HCT: 43.2 % (ref 39.0–52.0)
Hemoglobin: 14.9 g/dL (ref 13.0–17.0)
MCHC: 34.5 g/dL (ref 30.0–36.0)
MCV: 96.9 fl (ref 78.0–100.0)
Platelets: 232 10*3/uL (ref 150.0–400.0)
RBC: 4.46 Mil/uL (ref 4.22–5.81)
RDW: 14.4 % (ref 11.5–15.5)
WBC: 4 10*3/uL (ref 4.0–10.5)

## 2018-05-28 LAB — PSA: PSA: 2.6 ng/mL (ref 0.10–4.00)

## 2018-05-28 NOTE — Progress Notes (Signed)
Phone: [phone]   Subjective:  Patient presents today for their annual physical. Chief complaint-noted.   See problem oriented charting- ROS- full  review of systems was completed and negative except for:  Some anxiety, some stress in marriage but improving  The following were reviewed and entered/updated in epic: Past Medical History:  Diagnosis Date  . Anxiety   . Depression   . Detached retina    April 2012   Patient Active Problem List   Diagnosis Date Noted  . Erectile dysfunction 02/28/2015  . Anxiety state 10/22/2006   Past Surgical History:  Procedure Laterality Date  . APPENDECTOMY    . COLONOSCOPY  07/14/2010   hemorrhoids  . DEEP NECK LYMPH NODE BIOPSY / EXCISION     rt neck  . fracture left ankle    . RETINAL DETACHMENT REPAIR W/ SCLERAL BUCKLE LE  04/2010    Family History  Problem Relation Age of Onset  . Heart disease Mother        heart valve   . Heart disease Father        late 84s, nonsmoker, chol over 300  . Prostate cancer Father        late 34s  . Hypertension Father   . Arthritis Brother        psoriatic    Medications- reviewed and updated Current Outpatient Medications  Medication Sig Dispense Refill  . clorazepate (TRANXENE) 3.75 MG tablet Take 3.75 mg by mouth 2 (two) times daily as needed for anxiety.    Marland Kitchen FLUoxetine (PROZAC) 20 MG tablet Take 20 mg by mouth daily.    Marland Kitchen gabapentin (NEURONTIN) 400 MG capsule Take 400 mg by mouth 2 (two) times daily.    . Multiple Vitamin (MULTIVITAMIN) tablet Take 1 tablet by mouth daily.       No current facility-administered medications for this visit.     Allergies-reviewed and updated No Known Allergies  Social History   Social History Narrative   Married Jan 1984. 2 children - son and daughter. 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       Hobbies: exercise, flips houses with nephew    Objective  Objective:  BP 110/78 (BP Location: Left Arm, Patient Position: Sitting, Cuff Size: Normal)   Pulse 75   Temp 98.7 F (37.1 C) (Oral)   Ht 5' 11.25" (1.81 m)   Wt 175 lb (79.4 kg)   SpO2 94%   BMI 24.24 kg/m  Gen: NAD, resting comfortably HEENT: Mucous membranes are moist. Oropharynx normal Neck: no thyromegaly CV: RRR no murmurs rubs or gallops Lungs: CTAB no crackles, wheeze, rhonchi Abdomen: soft/nontender/nondistended/normal bowel sounds. No rebound or guarding.  Ext: no edema Skin: warm, dry Neuro: grossly normal, moves all extremities, PERRLA    Assessment and Plan  58 y.o. male presenting for annual physical.  Health Maintenance counseling: 1. Anticipatory guidance: Patient counseled regarding regular dental exams -q6 months, eye exams -yearly,  avoiding smoking and second hand smoke , limiting alcohol to 2 beverages per day .   2. Risk factor reduction:  Advised patient of need for regular exercise and diet rich and fruits and vegetables to reduce risk of heart attack and stroke. Exercise- last year was seeing trainer twice a week and doing two other days on his own - thrown off recently with covid 19. Diet-feels like eating better recently- down  1 lb from last year.  Wt Readings from Last 3 Encounters:  05/27/18 175 lb (79.4 kg)  05/23/17 176 lb (79.8 kg)  01/05/16 168 lb (76.2 kg)  3. Immunizations/screenings/ancillary studies- fully up to date Immunization History  Administered Date(s) Administered  . Influenza Whole 11/01/2008  . Influenza-Unspecified 10/15/2013, 11/18/2014, 10/19/2015  . Td 01/02/2003  . Tdap 02/18/2007, 05/23/2017  . Zoster Recombinat (Shingrix) 07/02/2017, 09/12/2017  4. Prostate cancer screening-  low risk PSA trend overall. Defer rectal unless PSA trend concerning.  Lab Results  Component Value Date   PSA 2.10 05/23/2017   PSA 1.82 12/28/2015   PSA 2.18 02/28/2015   5. Colon cancer screening - 07/2010 with 10 year repeat 6.  Skin cancer screening- saw dermatologist - looked at spot beside left eye was told benign. They are setting up a full body scan as well. advised regular sunscreen use. Denies worrisome, changing, or new skin lesions.  7. former smoker but quit in 1980. Would be candidate for AAA screen at 40  Status of chronic or acute concerns   No regular rx through [place]  Patient continues to see psychiatry- on prozac 20mg  daily. Clorazepate for rare prn use. Also on gabapentin. No depressed mood. No SI.  sees psych tomorrow. Short term sleep aid also being used.   Gets sildenafil for erectile dysfunction through urology as needed.   Mild hyperlipidemia but 10 year risk has been below 5%. Stopped aspirin last year since ascvd risk is low.  - he would buckle down on diet before using medication  Gets fair amount of sinus drainage every morning. At night when he goes to bed nose gets stopped up. Has never really had allergy issues. Has been going on for years.   1 year physical Lab/Order associations: fasting  Preventative health care - Plan: CBC, Comprehensive metabolic panel, Lipid panel, PSA  Hyperlipidemia, unspecified hyperlipidemia type - Plan: CBC, Comprehensive metabolic panel, Lipid panel  Screening for prostate cancer - Plan: PSA  Return precautions advised.  Garret Reddish, MD

## 2018-07-16 ENCOUNTER — Other Ambulatory Visit: Payer: Self-pay

## 2018-07-16 ENCOUNTER — Other Ambulatory Visit (INDEPENDENT_AMBULATORY_CARE_PROVIDER_SITE_OTHER): Payer: 59

## 2018-07-16 DIAGNOSIS — R972 Elevated prostate specific antigen [PSA]: Secondary | ICD-10-CM | POA: Diagnosis not present

## 2018-07-16 DIAGNOSIS — E785 Hyperlipidemia, unspecified: Secondary | ICD-10-CM

## 2018-07-16 LAB — PSA: PSA: 2.21 ng/mL (ref 0.10–4.00)

## 2018-07-16 LAB — LDL CHOLESTEROL, DIRECT: Direct LDL: 140 mg/dL

## 2018-12-03 ENCOUNTER — Telehealth: Payer: Self-pay | Admitting: Family Medicine

## 2018-12-03 NOTE — Telephone Encounter (Signed)
Called and spoke with pt and offered virtual. Transferred pt to be scheduled.

## 2018-12-03 NOTE — Telephone Encounter (Signed)
Ok to give work note or does he need a visit with you first?

## 2018-12-03 NOTE — Telephone Encounter (Signed)
I need to know date of diagnosis. Date of first symptoms. Any lingering symptoms particularly fever or cough/respiratory symptoms?  You can also offer video visit so I can make sure safe to return if that's easier for him. I may have follow up questions

## 2018-12-03 NOTE — Telephone Encounter (Signed)
See note

## 2018-12-03 NOTE — Telephone Encounter (Signed)
Patient called and would like to talk to Dr. Yong Channel or his CMA about getting a work note because he tested positive for covid and needs to return to work. Patient got tested at work. Please call patient back, thanks.

## 2018-12-08 ENCOUNTER — Ambulatory Visit (INDEPENDENT_AMBULATORY_CARE_PROVIDER_SITE_OTHER): Payer: 59 | Admitting: Family Medicine

## 2018-12-08 ENCOUNTER — Encounter: Payer: Self-pay | Admitting: Family Medicine

## 2018-12-08 VITALS — Temp 98.6°F | Ht 71.5 in | Wt 160.0 lb

## 2018-12-08 DIAGNOSIS — U071 COVID-19: Secondary | ICD-10-CM | POA: Diagnosis not present

## 2018-12-08 NOTE — Progress Notes (Signed)
Phone [phone] Virtual visit via Video note   Subjective:  Chief complaint: Chief Complaint  Patient presents with  . Follow-up    pos covid needs letter from office to go back    This visit type was conducted due to national recommendations for restrictions regarding the COVID-19 Pandemic (e.g. social distancing).  This format is felt to be most appropriate for this patient at this time balancing risks to patient and risks to population by having him in for in person visit.  No physical exam was performed (except for noted visual exam or audio findings with Telehealth visits).    Our team/I connected with Bella Kennedy at  3:40 PM EST by a video enabled telemedicine application ([email or web address] or caregility through epic) and verified that I am speaking with the correct person using two identifiers.  Location patient: [place] Location provider: [facility], office Persons participating in the virtual visit:  patient  Our team/I discussed the limitations of evaluation and management by telemedicine and the availability of in person appointments. In light of current covid-19 pandemic, patient also understands that we are trying to protect them by minimizing in office contact if at all possible.  The patient expressed consent for telemedicine visit and agreed to proceed. Patient understands insurance will be billed.   ROS- No fever, chills, cough,  runny nose, shortness of breath,  body aches, sore throat, headache, nausea, vomiting, diarrheal.    Past Medical History-  Patient Active Problem List   Diagnosis Date Noted  . Erectile dysfunction 02/28/2015  . Anxiety state 10/22/2006    Medications- reviewed and updated Current Outpatient Medications  Medication Sig Dispense Refill  . clorazepate (TRANXENE) 3.75 MG tablet Take 3.75 mg by mouth 2 (two) times daily as needed for anxiety.    Marland Kitchen FLUoxetine (PROZAC) 20 MG tablet Take 20 mg by mouth daily.    Marland Kitchen gabapentin (NEURONTIN) 400 MG  capsule Take 400 mg by mouth 2 (two) times daily.    . Multiple Vitamin (MULTIVITAMIN) tablet Take 1 tablet by mouth daily.       No current facility-administered medications for this visit.      Objective:  Temp 98.6 F (37 C) (Oral)   Ht 5' 11.5" (1.816 m)   Wt 160 lb (72.6 kg)   BMI 22.00 kg/m  self reported vitals Gen: NAD, resting comfortably Lungs: nonlabored, normal respiratory rate  Skin: appears dry, no obvious rash     Assessment and Plan   # Covid 15 S:Symptoms started on the 20th- noted some sinus drainage. Got mild sore throat on the left side with this. Wife cheryl had been feeling sick and before they went to the beach on the 23rd she got tested for covid and found out positive on 25th. On the 26h he could not get out of bed, had headache, feverish and chills alternating- they opted to leave to come home on the 27th. Mucinex and tylenol sinus extra strength were helpful- or would take aspirin or ibuprofen for headaches. Had slight cough but no shortness of breath and some body aches including chest and back.   Mild congestion but long term. Some lingering fatigue.Taste is returning but still cannot smell fully.   patient tested positive at work on 11/30 - by the next Tuesday found out he was positive. He has not had any fever after 12/04/2018. Stopped medicines on the 3rd and has felt better since then. Strength was still down but in last 2 days strength has largely  come back- doesn't feel like he could work out.   Has had some long term sinus congestion and is back to his baseline level.  A/P: Mr. Bartnik tested positive for COVID-19.  Fortunately respiratory symptoms and fever have resolved.  We discussed it would be safe for him to return to the office as he is past dates of [place] letter was written to work in support of this.  He will follow up if he has new or worsening symptoms.  I am hopeful lingering fatigue will clear.  He does have some mild sinus  congestion but reports that has been an intermittent issue over the year and this is essentially where his baseline is-we will monitor-could consider allergy treatment  Recommended follow up: As needed for acute concern  Lab/Order associations:   ICD-10-CM   1. COVID-19  U07.1    Return precautions advised.  Garret Reddish, MD

## 2018-12-08 NOTE — Patient Instructions (Signed)
There are no preventive care reminders to display for this patient.  Depression screen [facility] 2/9 05/28/2018  Decreased Interest 0  Down, Depressed, Hopeless 0  PHQ - 2 Score 0    Recommended follow up: No follow-ups on file.

## 2018-12-22 ENCOUNTER — Other Ambulatory Visit: Payer: Self-pay

## 2018-12-22 ENCOUNTER — Ambulatory Visit (INDEPENDENT_AMBULATORY_CARE_PROVIDER_SITE_OTHER): Payer: 59 | Admitting: Family Medicine

## 2018-12-22 ENCOUNTER — Ambulatory Visit: Payer: Self-pay

## 2018-12-22 ENCOUNTER — Encounter: Payer: Self-pay | Admitting: Family Medicine

## 2018-12-22 VITALS — Ht 71.5 in | Wt 162.0 lb

## 2018-12-22 DIAGNOSIS — R42 Dizziness and giddiness: Secondary | ICD-10-CM

## 2018-12-22 DIAGNOSIS — I951 Orthostatic hypotension: Secondary | ICD-10-CM | POA: Diagnosis not present

## 2018-12-22 DIAGNOSIS — R55 Syncope and collapse: Secondary | ICD-10-CM

## 2018-12-22 NOTE — Patient Instructions (Signed)
There are no preventive care reminders to display for this patient. Depression screen [facility] 2/9 05/28/2018  Decreased Interest 0  Down, Depressed, Hopeless 0  PHQ - 2 Score 0

## 2018-12-22 NOTE — Telephone Encounter (Signed)
Noted thanks °

## 2018-12-22 NOTE — Progress Notes (Signed)
Phone [phone] Virtual visit via Video note   Subjective:  Chief complaint: Chief Complaint  Patient presents with  . virtual  . covid symptoms   This visit type was conducted due to national recommendations for restrictions regarding the COVID-19 Pandemic (e.g. social distancing).  This format is felt to be most appropriate for this patient at this time balancing risks to patient and risks to population by having him in for in person visit.  No physical exam was performed (except for noted visual exam or audio findings with Telehealth visits).    Our team/I connected with Bella Kennedy at  3:40 PM EST by a video enabled telemedicine application ([email or web address] or caregility through epic) and verified that I am speaking with the correct person using two identifiers.  Location patient: [place] Location provider: [facility], office Persons participating in the virtual visit:  patient  Our team/I discussed the limitations of evaluation and management by telemedicine and the availability of in person appointments. In light of current covid-19 pandemic, patient also understands that we are trying to protect them by minimizing in office contact if at all possible.  The patient expressed consent for telemedicine visit and agreed to proceed. Patient understands insurance will be billed.   ROS- No facial or extremity weakness. No slurred words or trouble swallowing. no blurry vision or double vision. No paresthesias. No confusion or word finding difficulties.    Past Medical History-  Patient Active Problem List   Diagnosis Date Noted  . Erectile dysfunction 02/28/2015  . Anxiety state 10/22/2006    Medications- reviewed and updated Current Outpatient Medications  Medication Sig Dispense Refill  . clorazepate (TRANXENE) 3.75 MG tablet Take 3.75 mg by mouth 2 (two) times daily as needed for anxiety.    Marland Kitchen FLUoxetine (PROZAC) 20 MG tablet Take 20 mg by mouth daily.    Marland Kitchen gabapentin  (NEURONTIN) 400 MG capsule Take 400 mg by mouth 2 (two) times daily.    . Multiple Vitamin (MULTIVITAMIN) tablet Take 1 tablet by mouth daily.      . sildenafil (REVATIO) 20 MG tablet Take 20 mg by mouth 3 (three) times daily.     No current facility-administered medications for this visit.     Objective:  Ht 5' 11.5" (1.816 m)   Wt 162 lb (73.5 kg)   BMI 22.28 kg/m  self reported vitals Gen: NAD, resting comfortably Lungs: nonlabored, normal respiratory rate  Skin: appears dry, no obvious rash     Assessment and Plan   Presyncope  Recent history of COVID symptoms S: On Saturday, patient played pickle ball until around 3 PM (played several games and was working pretty hard) for first real activity after recovering from covid. He came home and had not eaten anything since breakfast. Had a couple of beers but no water before the event or during event- had light amount water on way home. He got home around 5 30. Took a shower then had a beer. They were having a party for his granddaughter.   He had been sitting for a little bit and then got up. Pt started feeling light headed and he leaned up against the wall while carrying on a conversation and things started getting black (with a lot of dizziness in top of head) and he started falling and next door neighbor that he was talking to grabbed him and placed him in a chair. Per neighbor and his wife- eyes were glazy but he never passed out. Once he sat  down the light headedness and darkness in vision started going away. This was the first time he has experienced this.No chest pain, shortness of breath, left arm or neck pain, excess diaphoresis.  No issues since that occurred. He still admits struggles to stay well hydrated    EMS came and examined pressure and did an EKG that was normal. Vitals- 138/82, 96 HR at that time. They suggested him go to the hospital and he declined going to the ER (wanted to be there for granddaughters birthday).    Pt states he took 2 sildenifil Friday and it has been a while since taking that he thinks this.  He wonders if this contributed to the episode. A/P: 58 year old male with presyncopal episode-I strongly suspect orthostatic intolerance/hypotension after patient exercised for the first time post Covid along with only eating 1 meal through about 5:30 PM and drinking minimal water.  No other red flag symptoms and has not had recurrence.  We discussed could embark on cardiac work-up but ultimately he decided would prefer to do this only if have recurrent issues-he agrees to go to the emergency room and or follow-up with me if recurrent issues.  We stressed the importance of staying well-hydrated and not skipping meals.  Lab/Order associations:   ICD-10-CM   1. Orthostatic hypotension  I95.1   2. Postural dizziness with presyncope  R42    R55    Return precautions advised.  Garret Reddish, MD

## 2018-12-22 NOTE — Telephone Encounter (Signed)
FYI-patient has appointment with you today 

## 2018-12-22 NOTE — Telephone Encounter (Signed)
See note

## 2018-12-22 NOTE — Telephone Encounter (Signed)
Patient called stating that on Saturday he he passed out .  He states that he is post COVID-19 2-3 weeks. He says that he went and played pickle ball for the first real activity post covid. He came home had not eaten anything sinces breakfast had a couple beers and walked inside and felt everything go black. He states some friends stopped him from falling. He was checked by EMS BP 138/70's 12 lead was done. He refused transport to hospital. He feels well today. He states that he had taken a medication prescribed by his urologist Friday night for erection which he had not used since COVID Dx. Care advice read to patient. Call transferred to office for scheduling.  Reason for Disposition . [1] All other patients AND [2] now alert and feels fine(Exception: SIMPLE FAINT due to stress, pain, prolonged standing, or suddenly standing)  Answer Assessment - Initial Assessment Questions 1. ONSET: "How long were you unconscious?" (minutes) "When did it happen?"     Saturday never conpletely out 2. CONTENT: "What happened during period of unconsciousness?" (e.g., seizure activity)      none 3. MENTAL STATUS: "Alert and oriented now?" (oriented x 3 = name, month, location)      x3 now 4. TRIGGER: "What do you think caused the fainting?" "What were you doing just before you fainted?"  (e.g., exercise, sudden standing up, prolonged standing)    Played pickle ball then came home without eating drinking only beer 5. RECURRENT SYMPTOM: "Have you ever passed out before?" If so, ask: "When was the last time?" and "What happened that time?"     no 6. INJURY: "Did you sustain any injury during the fall?"     No 7. CARDIAC SYMPTOMS: "Have you had any of the following symptoms: chest pain, difficulty breathing, palpitations?"     No but heart HX 8. NEUROLOGIC SYMPTOMS: "Have you had any of the following symptoms: headache, numbness, vertigo, weakness?"     No just lightheadedness 9. GI SYMPTOMS: "Have you had any of  the following symptoms: abdominal pain, vomiting, diarrhea, blood in stools?"     no 10. OTHER SYMPTOMS: "Do you have any other symptoms?"       None just lightheaded 11. PREGNANCY: "Is there any chance you are pregnant?" "When was your last menstrual period?"     N/A  Protocols used: [facility]

## 2019-03-12 ENCOUNTER — Ambulatory Visit: Payer: 59

## 2019-10-07 NOTE — Patient Instructions (Addendum)
Health Maintenance Due  Topic Date Due  . COVID-19 Vaccine (1) Has not gotten the vaccine. Declines for now Never done  . INFLUENZA VACCINE Declined in office flu shot. Patient stated that he will get it thru his job. 08/02/2019    Aleve twice a day 7 days. Ice if hurts  Team please give him rotator cuff handout- do exercises 3x a week for 1 month then once week for maintenance  Avoid any exercise that increases pain more than 1-2/10  Can see Dr. Delilah Shan if not improving within next month

## 2019-10-07 NOTE — Progress Notes (Signed)
Phone [phone] In person visit   Subjective:   Alexander Gonzales is a 59 y.o. year old very pleasant male patient who presents for/with See problem oriented charting Chief Complaint  Patient presents with  . Covid Antibody Test   This visit occurred during the SARS-CoV-2 public health emergency.  Safety protocols were in place, including screening questions prior to the visit, additional usage of staff PPE, and extensive cleaning of exam room while observing appropriate contact time as indicated for disinfecting solutions.   Past Medical History-  Patient Active Problem List   Diagnosis Date Noted  . Erectile dysfunction 02/28/2015  . Anxiety state 10/22/2006    Medications- reviewed and updated Current Outpatient Medications  Medication Sig Dispense Refill  . clorazepate (TRANXENE) 3.75 MG tablet Take 3.75 mg by mouth 2 (two) times daily as needed for anxiety.    Marland Kitchen FLUoxetine (PROZAC) 20 MG tablet Take 20 mg by mouth daily.    Marland Kitchen gabapentin (NEURONTIN) 400 MG capsule Take 400 mg by mouth 2 (two) times daily.    . Multiple Vitamin (MULTIVITAMIN) tablet Take 1 tablet by mouth daily.      . temazepam (RESTORIL) 15 MG capsule Take 15 mg by mouth at bedtime.    . sildenafil (REVATIO) 20 MG tablet Take 20 mg by mouth 3 (three) times daily. (Patient not taking: Reported on 10/08/2019)     No current facility-administered medications for this visit.     Objective:  BP 122/80   Pulse 67   Temp (!) 97.2 F (36.2 C) (Temporal)   Resp 18   Ht 6' (1.829 m)   Wt 173 lb (78.5 kg)   SpO2 96%   BMI 23.46 kg/m  Gen: NAD, resting comfortably CV: RRR  Lungs: nonlabored, normal respiratory rate Abdomen: soft/nondistended Right Shoulder: Inspection reveals no abnormalities, atrophy or asymmetry. ROM is full in all planes. Rotator cuff strength normal throughout. Positive Neear and Hawkin though weakly postive. Empty can negative. Internal rotation and push off from back with  significant pain.  No painful arc and no drop arm sign.       Assessment and Plan  #covid 19 vaccination discussion S: patient's company with mandatory vaccine policy. They are allowing religious (he is sending in 15th of this month) and medical exemptions (they are allowing if patient has antibodies that he can have medical exemption)  He has anxiety about the vaccine- feels like it is more a political thing than he has ever seen before. This makes him very nervous about getting vaccination.  A/P: I shared with patient I think getting the vaccination even with prior infection is very reasonable. On the other hand, the fact that he had overall mild illness last year would hopefully offer him protection particularly if he still had antibodies- he would liek to test today. I am willing to attest to some ongoing protection from prior infection if antibodies are positive   # Sinus Drainage S:Patient mentioned that when he lays down at night he does have sinus drainage. No medication has been taken. Not necessarily worse in fall and spring.   Has not tried neil med sinus rinse- we have discussed this issue in the past.  A/P: we discussed again neil med sinus rinse/neti pot- I think that would be reasonable given his ongoing issues. Doubt covid as not acute issue   # Ringing in Ears S:Started 2-3 months ago. Equal in both ears.  Not pulsatile.   Could have low level hearing oss.  He wonders if stress contributes   A/P: discussed hearing loss potential- he would prefer to simply monitor for now after reassurance about likely benign nature   # Right shoulder pain S:2 months of issues. Has lowered weight but still bothers him. Overhead press really bothers him.   A/P: appears to have rotator cuff injury on the right. Recommended  Aleve twice a day 7 days, ice if painful, and exercises from sports medicine advisor. Offered PT and sports medicine but he declined for now. Has seen Dr. Delilah Shan in past  and will follow up if fails to improve.   Recommended follow up: keep physical in January and update bloodwork at that time.  Future Appointments  Date Time Provider [place]  01/06/2020  8:00 AM Marin Olp, MD [facility]  01/28/2020  8:00 AM Rankin, Clent Demark, MD [facility] None    Lab/Order associations:   ICD-10-CM   1. Right rotator cuff tendonitis  M75.81   2. History of COVID-19  Z86.16 SARS CoV2 Serology(COVID19) AB(IgG,IgM),Immunoassay    SARS CoV2 Serology(COVID19) AB(IgG,IgM),Immunoassay   Time Spent: 30 minutes of total time (4:25 PM- 4:49 PM, 8:29- 8:35 PM) was spent on the date of the encounter performing the following actions: chart review prior to seeing the patient, obtaining history, performing a medically necessary exam, counseling on the treatment plan, placing orders, and documenting in our EHR.   Return precautions advised.  Garret Reddish, MD

## 2019-10-08 ENCOUNTER — Encounter: Payer: Self-pay | Admitting: Family Medicine

## 2019-10-08 ENCOUNTER — Ambulatory Visit: Payer: 59 | Admitting: Family Medicine

## 2019-10-08 ENCOUNTER — Other Ambulatory Visit: Payer: Self-pay

## 2019-10-08 VITALS — BP 122/80 | HR 67 | Temp 97.2°F | Resp 18 | Ht 72.0 in | Wt 173.0 lb

## 2019-10-08 DIAGNOSIS — M7581 Other shoulder lesions, right shoulder: Secondary | ICD-10-CM | POA: Diagnosis not present

## 2019-10-08 DIAGNOSIS — Z8616 Personal history of COVID-19: Secondary | ICD-10-CM

## 2019-10-09 ENCOUNTER — Encounter: Payer: Self-pay | Admitting: Family Medicine

## 2019-10-09 LAB — SARS COV-2 SEROLOGY(COVID-19)AB(IGG,IGM),IMMUNOASSAY
SARS CoV-2 AB IgG: POSITIVE — AB
SARS CoV-2 IgM: NEGATIVE

## 2020-01-05 NOTE — Patient Instructions (Addendum)
Please stop by lab before you go If you have mychart- we will send your results within 3 business days of [place] receiving them.  If you do not have mychart- we will call you about results within 5 business days of [place] receiving them.  *please also note that you will see labs on mychart as soon as they post. I will later go in and write notes on them- will say "notes from Dr. Durene Cal"  Lets work on getting weight back down 10 lbs or so. Get back on the exercise and healthy eating  Health Maintenance Due  Topic Date Due   COVID-19 Vaccine (1) Has not received his vaccine. Had antibodies at last visit Never done   INFLUENZA VACCINE Declined in office flu shot  08/02/2019

## 2020-01-05 NOTE — Progress Notes (Signed)
Phone: [phone]   Subjective:  Patient presents today for their annual physical. Chief complaint-noted.   See problem oriented charting- ROS- full  review of systems was completed and negative  except for: activity change (hard to get back to gym after mask mandate lifted), weight change- up, sneezing- more towards bedtime- can be 7-8x sometimes, nighttime congestion, shortness of breath- feels like needs a good deep breath sometimes- that helps- also has not been as active, neck stiffness mild issues and prior rotator cuff issues, nervous/anxious, sleep disturbance (temazepam for sleep)  The following were reviewed and entered/updated in epic: Past Medical History:  Diagnosis Date  . Anxiety   . Depression   . Detached retina    April 2012   Patient Active Problem List   Diagnosis Date Noted  . Erectile dysfunction 02/28/2015  . Anxiety state 10/22/2006   Past Surgical History:  Procedure Laterality Date  . APPENDECTOMY    . COLONOSCOPY  07/14/2010   hemorrhoids  . DEEP NECK LYMPH NODE BIOPSY / EXCISION     rt neck  . fracture left ankle    . RETINAL DETACHMENT REPAIR W/ SCLERAL BUCKLE LE  04/2010    Family History  Problem Relation Age of Onset  . Heart disease Mother        heart valve   . Heart disease Father        late 41s, nonsmoker, chol over 300  . Prostate cancer Father        late 28s  . Hypertension Father   . Arthritis Brother        psoriatic    Medications- reviewed and updated Current Outpatient Medications  Medication Sig Dispense Refill  . clorazepate (TRANXENE) 3.75 MG tablet Take 3.75 mg by mouth 2 (two) times daily as needed for anxiety.    Marland Kitchen FLUoxetine (PROZAC) 20 MG tablet Take 20 mg by mouth daily.    Marland Kitchen gabapentin (NEURONTIN) 400 MG capsule Take 400 mg by mouth 2 (two) times daily.    . Multiple Vitamin (MULTIVITAMIN) tablet Take 1 tablet by mouth daily.    . temazepam (RESTORIL) 15 MG capsule Take 15 mg by mouth at bedtime.    .  sildenafil (REVATIO) 20 MG tablet Take 20 mg by mouth 3 (three) times daily. (Patient not taking: No sig reported)     No current facility-administered medications for this visit.    Allergies-reviewed and updated No Known Allergies  Social History   Social History Narrative   Married Jan 1984. 2 children - son and daughter. 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       Hobbies: exercise, flips houses with nephew   Objective  Objective:  BP 120/80   Pulse 76   Temp 97.8 F (36.6 C) (Temporal)   Resp 18   Ht 6' (1.829 m)   Wt 184 lb (83.5 kg)   SpO2 96%   BMI 24.95 kg/m  Gen: NAD, resting comfortably HEENT: Mucous membranes are moist. Oropharynx normal Neck: no thyromegaly CV: RRR no murmurs rubs or gallops Lungs: CTAB no crackles, wheeze, rhonchi Abdomen: soft/nontender/nondistended/normal bowel sounds. No rebound or guarding.  Ext: no edema Skin: warm, dry Neuro: grossly normal, moves all extremities, PERRLA   Assessment and Plan  60 y.o. male presenting for annual physical.  Health Maintenance counseling: 1. Anticipatory guidance: Patient counseled regarding regular dental exams -  q6 months, eye exams -yearly,  avoiding smoking and second hand smoke , limiting alcohol to 2 beverages per day .   2. Risk factor reduction:  Advised patient of need for regular exercise and diet rich and fruits and vegetables to reduce risk of heart attack and stroke. Exercise-in 2019 was working with a Psychologist, educational twice a week and working out 2 days on his own but this was started off but covid-19 in 2020-today patient reports- dropped down after mask mandate and then hard to get back into gym once this was lifted. Diet-up 9 lbs from last physical.  Weight 175 at last physical May 2020 Wt Readings from Last 3 Encounters:  01/06/20 184 lb (83.5 kg)  10/08/19 173 lb (78.5 kg)  12/22/18 162 lb (73.5 kg)   3. Immunizations/screenings/ancillary studies-patient with antibodies to COVID-19 at October 2021 visit-he prefers to hold off on immunization due to natural protection.  Discussed flu shot  Immunization History  Administered Date(s) Administered  . Influenza Whole 11/01/2008  . Influenza-Unspecified 10/15/2013, 11/18/2014, 10/19/2015, 10/13/2017, 10/03/2018  . Td 01/02/2003  . Tdap 02/18/2007, 05/23/2017  . Zoster Recombinat (Shingrix) 07/02/2017, 09/12/2017  4. Prostate cancer screening- patient with low risk prostate specific antigen trend-we will repeat today and continue to monitor.  Did have a slight bump at physical last year but came back down on repeat Lab Results  Component Value Date   PSA 2.21 07/16/2018   PSA 2.60 05/28/2018   PSA 2.10 05/23/2017   5. Colon cancer screening - July 2012 for 10-year repeat. Dr. Leone Payor- viewed report today.  6. Skin cancer screening-saw dermatology prior to physical in May 2020 to evaluate a spot next to his eye and he was told this was benign-plan was for full body check at a later date-he reports had a few freezes on scalp but otherwise encouraging. advised regular sunscreen use. Denies worrisome, changing, or new skin lesions.  7. Former smoker-quit in 1980, would be a candidate for AAA screen at 64 but no regular screening required 8. STD screening - monogamous and declines  Status of chronic or acute concerns   #Rotator cuff tendinitis as noted October 2021 visit-recommended Aleve twice a day for 7 days, home exercise program and follow-up with Dr. Penni Bombard of orthopedics if no improvement.  Today patient reports better but not as severe. Wants to hold off on further follow up   #Orthostatic intolerance post COVID-20 December 2018-this occurred after patient exercised for the first time post Covid along with only eating 1 meal and being dehydrated.  We opted to pursue cardiac work-up only if recurrence-patient reports no recurrence  #  Anxiety-managed through psychiatry S:Medication: Prozac 20 mg daily, clorazepate for rare as needed use.  Also on gabapentin.  Denies depressed mood or suicidal ideation.  Last she was also using a short-term sleep aid A/P: doing well- continue follow up with Dr. Sydell Axon psychiatry   #hyperlipidemia S: Medication:None Lab Results  Component Value Date   CHOL 228 (H) 05/28/2018   HDL 76.90 05/28/2018   LDLCALC 138 (H) 05/28/2018   LDLDIRECT 140.0 07/16/2018   TRIG 67.0 05/28/2018   CHOLHDL 3 05/28/2018   A/P: 10-year ASCVD risk only 6.6%.  We discussed this is a low risk territory-he may remain off aspirin which we stopped last year.  We discussed updating blood work and reassessing 10-year risk   #Sinus drainage each morning-years of issues.  Feels like nose gets stopped up when he goes to bed-denies other obvious allergic symptoms.  Declines further work-up for now. Could try zyrtec/claritin before bed but not a major issue for him   Recommended follow up: 1 year physical Future Appointments  Date Time Provider [place]  01/28/2020  8:00 AM Rankin, Clent Demark, MD [facility] None   Lab/Order associations: fasting   ICD-10-CM   1. Preventative health care  Z00.00 CBC with Differential/Platelet    Comprehensive metabolic panel    Lipid panel    PSA  2. Anxiety state  F41.1   3. Hyperlipidemia, unspecified hyperlipidemia type  E78.5 CBC with Differential/Platelet    Comprehensive metabolic panel    Lipid panel  4. Screening for prostate cancer  Z12.5 PSA    No orders of the defined types were placed in this encounter.   Return precautions advised.  Garret Reddish, MD

## 2020-01-06 ENCOUNTER — Other Ambulatory Visit: Payer: Self-pay

## 2020-01-06 ENCOUNTER — Encounter: Payer: Self-pay | Admitting: Family Medicine

## 2020-01-06 ENCOUNTER — Ambulatory Visit (INDEPENDENT_AMBULATORY_CARE_PROVIDER_SITE_OTHER): Payer: 59 | Admitting: Family Medicine

## 2020-01-06 VITALS — BP 120/80 | HR 76 | Temp 97.8°F | Resp 18 | Ht 72.0 in | Wt 184.0 lb

## 2020-01-06 DIAGNOSIS — Z125 Encounter for screening for malignant neoplasm of prostate: Secondary | ICD-10-CM | POA: Diagnosis not present

## 2020-01-06 DIAGNOSIS — F411 Generalized anxiety disorder: Secondary | ICD-10-CM

## 2020-01-06 DIAGNOSIS — Z0000 Encounter for general adult medical examination without abnormal findings: Secondary | ICD-10-CM | POA: Diagnosis not present

## 2020-01-06 DIAGNOSIS — E785 Hyperlipidemia, unspecified: Secondary | ICD-10-CM | POA: Diagnosis not present

## 2020-01-06 LAB — COMPREHENSIVE METABOLIC PANEL
ALT: 19 U/L (ref 0–53)
AST: 27 U/L (ref 0–37)
Albumin: 4.2 g/dL (ref 3.5–5.2)
Alkaline Phosphatase: 52 U/L (ref 39–117)
BUN: 14 mg/dL (ref 6–23)
CO2: 29 mEq/L (ref 19–32)
Calcium: 9.2 mg/dL (ref 8.4–10.5)
Chloride: 106 mEq/L (ref 96–112)
Creatinine, Ser: 1.13 mg/dL (ref 0.40–1.50)
GFR: 70.83 mL/min (ref >60.00)
Glucose, Bld: 92 mg/dL (ref 70–99)
Potassium: 4.2 mEq/L (ref 3.5–5.1)
Sodium: 141 mEq/L (ref 135–145)
Total Bilirubin: 0.6 mg/dL (ref 0.2–1.2)
Total Protein: 6.9 g/dL (ref 6.0–8.3)

## 2020-01-06 LAB — CBC WITH DIFFERENTIAL/PLATELET
Basophils Absolute: 0 10*3/uL (ref 0.0–0.1)
Basophils Relative: 0.5 % (ref 0.0–3.0)
Eosinophils Absolute: 0 10*3/uL (ref 0.0–0.7)
Eosinophils Relative: 1.2 % (ref 0.0–5.0)
HCT: 40.7 % (ref 39.0–52.0)
Hemoglobin: 13.8 g/dL (ref 13.0–17.0)
Lymphocytes Relative: 38 % (ref 12.0–46.0)
Lymphs Abs: 1.4 10*3/uL (ref 0.7–4.0)
MCHC: 34 g/dL (ref 30.0–36.0)
MCV: 96.7 fl (ref 78.0–100.0)
Monocytes Absolute: 0.4 10*3/uL (ref 0.1–1.0)
Monocytes Relative: 12.2 % — ABNORMAL HIGH (ref 3.0–12.0)
Neutro Abs: 1.8 10*3/uL (ref 1.4–7.7)
Neutrophils Relative %: 48.1 % (ref 43.0–77.0)
Platelets: 234 10*3/uL (ref 150.0–400.0)
RBC: 4.21 Mil/uL — ABNORMAL LOW (ref 4.22–5.81)
RDW: 14.5 % (ref 11.5–15.5)
WBC: 3.7 10*3/uL — ABNORMAL LOW (ref 4.0–10.5)

## 2020-01-06 LAB — LIPID PANEL
Cholesterol: 255 mg/dL — ABNORMAL HIGH (ref 0–200)
HDL: 80.6 mg/dL (ref >39.00)
LDL Cholesterol: 161 mg/dL — ABNORMAL HIGH (ref 0–99)
NonHDL: 174.22
Total CHOL/HDL Ratio: 3
Triglycerides: 66 mg/dL (ref 0.0–149.0)
VLDL: 13.2 mg/dL (ref 0.0–40.0)

## 2020-01-06 LAB — PSA: PSA: 2.65 ng/mL (ref 0.10–4.00)

## 2020-01-28 ENCOUNTER — Encounter (INDEPENDENT_AMBULATORY_CARE_PROVIDER_SITE_OTHER): Payer: 59 | Admitting: Ophthalmology

## 2020-02-03 ENCOUNTER — Other Ambulatory Visit: Payer: Self-pay

## 2020-02-03 ENCOUNTER — Encounter (INDEPENDENT_AMBULATORY_CARE_PROVIDER_SITE_OTHER): Payer: Self-pay | Admitting: Ophthalmology

## 2020-02-03 ENCOUNTER — Ambulatory Visit (INDEPENDENT_AMBULATORY_CARE_PROVIDER_SITE_OTHER): Payer: 59 | Admitting: Ophthalmology

## 2020-02-03 DIAGNOSIS — H31009 Unspecified chorioretinal scars, unspecified eye: Secondary | ICD-10-CM | POA: Insufficient documentation

## 2020-02-03 DIAGNOSIS — H35372 Puckering of macula, left eye: Secondary | ICD-10-CM

## 2020-02-03 DIAGNOSIS — Z8669 Personal history of other diseases of the nervous system and sense organs: Secondary | ICD-10-CM | POA: Diagnosis not present

## 2020-02-03 NOTE — Assessment & Plan Note (Signed)
Status post vitrectomy membrane peel 06/22/2010 for severe epiretinal membrane left eye, residual distortion remains

## 2020-02-03 NOTE — Assessment & Plan Note (Signed)
Macula off temporal retinal detachment left eye, repaired via scleral buckle 04-09-10

## 2020-02-03 NOTE — Progress Notes (Signed)
02/03/2020     CHIEF COMPLAINT Patient presents for Retina Follow Up (2 Year F/U OU//Pt denies noticeable changes to [place] OU since last visit. Pt denies ocular pain, flashes of light, or floaters OU. //)   HISTORY OF PRESENT ILLNESS: Alexander Gonzales is a 60 y.o. male who presents to the clinic today for:   HPI    Retina Follow Up    Patient presents with  Other.  In both eyes.  This started 2 years ago.  Severity is mild.  Duration of 2 years.  Since onset it is stable. Additional comments: 2 Year F/U OU  Pt denies noticeable changes to [place] OU since last visit. Pt denies ocular pain, flashes of light, or floaters OU.          Last edited by Rockie Neighbours, [place] on 02/03/2020  8:12 AM. (History)      Referring physician: Marin Olp, [place],   [id number]  HISTORICAL INFORMATION:   Selected notes from the Home: No current outpatient medications on file. (Ophthalmic Drugs)   No current facility-administered medications for this visit. (Ophthalmic Drugs)   Current Outpatient Medications (Other)  Medication Sig  . clorazepate (TRANXENE) 3.75 MG tablet Take 3.75 mg by mouth 2 (two) times daily as needed for anxiety.  Marland Kitchen FLUoxetine (PROZAC) 20 MG tablet Take 20 mg by mouth daily.  Marland Kitchen gabapentin (NEURONTIN) 400 MG capsule Take 400 mg by mouth 2 (two) times daily.  . Multiple Vitamin (MULTIVITAMIN) tablet Take 1 tablet by mouth daily.  . sildenafil (REVATIO) 20 MG tablet Take 20 mg by mouth 3 (three) times daily. (Patient not taking: No sig reported)  . temazepam (RESTORIL) 15 MG capsule Take 15 mg by mouth at bedtime.   No current facility-administered medications for this visit. (Other)      REVIEW OF SYSTEMS:    ALLERGIES No Known Allergies  PAST MEDICAL HISTORY Past Medical History:  Diagnosis Date  . Anxiety   . Depression   . Detached retina    April 2012   Past Surgical History:   Procedure Laterality Date  . APPENDECTOMY    . COLONOSCOPY  07/14/2010   hemorrhoids  . DEEP NECK LYMPH NODE BIOPSY / EXCISION     rt neck  . fracture left ankle    . RETINAL DETACHMENT REPAIR W/ SCLERAL BUCKLE LE  04/2010    FAMILY HISTORY Family History  Problem Relation Age of Onset  . Heart disease Mother        heart valve   . Heart disease Father        late 52s, nonsmoker, chol over 300  . Prostate cancer Father        late 81s  . Hypertension Father   . Arthritis Brother        psoriatic    SOCIAL HISTORY Social History   Tobacco Use  . Smoking status: Former Smoker    Types: Cigarettes    Quit date: 05/10/1978    Years since quitting: 41.7  . Smokeless tobacco: Never Used  . Tobacco comment: random cigarette with beer in teenage years, very rare cigar once in 3 months  Substance Use Topics  . Alcohol use: Yes    Alcohol/week: 4.0 standard drinks    Types: 4 Standard drinks or equivalent per week    Comment: occasional alcohol intake, glass of wine 3-4 days a week  .  Drug use: No         OPHTHALMIC EXAM:  Base Eye Exam    Visual Acuity (ETDRS)      Right Left   Dist cc 20/20 20/20   Correction: Glasses       Tonometry (Tonopen, 8:12 AM)      Right Left   Pressure 14 14       Pupils      Dark Light Shape React APD   Right 7 6 Round Brisk None   Left 5 4 Round Brisk None       Visual Fields (Counting fingers)      Left Right    Full Full       Extraocular Movement      Right Left    Full Full       Neuro/Psych    Oriented x3: Yes   Mood/Affect: Normal       Dilation    Both eyes: 1.0% Mydriacyl, 2.5% Phenylephrine @ 8:16 AM        Slit Lamp and Fundus Exam    External Exam      Right Left   External Normal Normal       Slit Lamp Exam      Right Left   Lids/Lashes Normal Normal   Conjunctiva/Sclera White and quiet White and quiet   Cornea Clear Clear   Anterior Chamber Deep and quiet Deep and quiet   Iris Round and  reactive Round and reactive   Lens 1+ Nuclear sclerosis Posterior chamber intraocular lens   Anterior Vitreous Normal Normal       Fundus Exam      Right Left   Posterior Vitreous Normal Normal   Disc Normal Normal   C/D Ratio 0.4 0.5   Macula Normal Normal   Vessels Normal Normal   Periphery No holes or tears good buckle, cryo scars temporally          IMAGING AND PROCEDURES  Imaging and Procedures for 02/03/20  Color Fundus Photography Optos - OU - Both Eyes       Right Eye Progression has been stable. Disc findings include normal observations. Macula : normal observations. Vessels : normal observations. Periphery : normal observations.   Left Eye Progression has been stable. Disc findings include normal observations. Macula : normal observations. Vessels : normal observations. Periphery : normal observations.   Notes Scleral buckle, cryopexy temporally left eye.  Clear media OU.                ASSESSMENT/PLAN:  History of retinal detachment Macula off temporal retinal detachment left eye, repaired via scleral buckle 04-09-10  Left epiretinal membrane Status post vitrectomy membrane peel 06/22/2010 for severe epiretinal membrane left eye, residual distortion remains      ICD-10-CM   1. Retinal scar, unspecified laterality  H31.009 Color Fundus Photography Optos - OU - Both Eyes  2. History of retinal detachment  Z86.69   3. Left epiretinal membrane  H35.372 Color Fundus Photography Optos - OU - Both Eyes    1.  2.  3.  Ophthalmic Meds Ordered this visit:  No orders of the defined types were placed in this encounter.      Return in about 2 years (around 02/02/2022) for DILATE OU, OCT, COLOR FP.  There are no Patient Instructions on file for this visit.   Explained the diagnoses, plan, and follow up with the patient and they expressed understanding.  Patient expressed understanding of the  importance of proper follow up care.   Clent Demark Shandi Godfrey  M.D. Diseases & Surgery of the Retina and Vitreous Retina & Diabetic [place] 02/03/20     Abbreviations: M myopia (nearsighted); A astigmatism; H hyperopia (farsighted); P presbyopia; Mrx spectacle prescription;  CTL contact lenses; OD right eye; OS left eye; OU both eyes  XT exotropia; ET esotropia; PEK punctate epithelial keratitis; PEE punctate epithelial erosions; DES dry eye syndrome; MGD meibomian gland dysfunction; ATs artificial tears; PFAT's preservative free artificial tears; [place] nuclear sclerotic cataract; PSC posterior subcapsular cataract; ERM epi-retinal membrane; PVD posterior vitreous detachment; RD retinal detachment; DM diabetes mellitus; DR diabetic retinopathy; NPDR non-proliferative diabetic retinopathy; PDR proliferative diabetic retinopathy; CSME clinically significant macular edema; DME diabetic macular edema; dbh dot blot hemorrhages; CWS cotton wool spot; POAG primary open angle glaucoma; C/D cup-to-disc ratio; HVF humphrey visual field; GVF goldmann visual field; OCT optical coherence tomography; IOP intraocular pressure; BRVO Branch retinal vein occlusion; CRVO central retinal vein occlusion; CRAO central retinal artery occlusion; BRAO branch retinal artery occlusion; RT retinal tear; SB scleral buckle; PPV pars plana vitrectomy; VH Vitreous hemorrhage; PRP panretinal laser photocoagulation; IVK intravitreal kenalog; VMT vitreomacular traction; MH Macular hole;  NVD neovascularization of the disc; NVE neovascularization elsewhere; AREDS age related eye disease study; ARMD age related macular degeneration; POAG primary open angle glaucoma; EBMD epithelial/anterior basement membrane dystrophy; ACIOL anterior chamber intraocular lens; IOL intraocular lens; PCIOL posterior chamber intraocular lens; Phaco/IOL phacoemulsification with intraocular lens placement; [place] photorefractive keratectomy; LASIK laser assisted in situ keratomileusis; HTN hypertension; DM diabetes mellitus; COPD  chronic obstructive pulmonary disease

## 2020-11-06 ENCOUNTER — Encounter: Payer: Self-pay | Admitting: Internal Medicine

## 2020-12-19 NOTE — Patient Instructions (Addendum)
Health Maintenance Due  Topic Date Due   COLONOSCOPY - please call to schedule- this is very important for your long term health  [facility] contact Address: [address], [place], [place] [address] Phone: [phone]  07/13/2020   Trial allegra or claritin or zyrtec or xyzal before bed. If no impovement within a few weeks also consider neil med sinus rinse or similar product- make sure to follow water instructions  2 ideas for anxiety/stress:  Husband material podcast Pause app by Malena Peer- resiliency  Consider hydrocortisone cream 2.5% twice daily for a week to calm down itch/hemorrhoid  Recommended follow up: Return in about 1 year (around 01/09/2022) for physical or sooner if needed.

## 2020-12-19 NOTE — Progress Notes (Signed)
Phone: [phone]   Subjective:  Patient presents today for their annual physical. Chief complaint-noted.   See problem oriented charting- ROS- full  review of systems was completed and negative  except for: Tinnitus, less exercise, congestion, pnd, sinus pressure, sneezing, diarrhea, rectal itching, joint pain, anxiety   The following were reviewed and entered/updated in epic: Past Medical History:  Diagnosis Date   Anxiety    Depression    Detached retina    April 2012   Patient Active Problem List   Diagnosis Date Noted   Anxiety state 10/22/2006    Priority: Medium    Erectile dysfunction 02/28/2015    Priority: Low   Retinal scar 02/03/2020   History of retinal detachment 02/03/2020   Left epiretinal membrane 02/03/2020   Past Surgical History:  Procedure Laterality Date   APPENDECTOMY     COLONOSCOPY  07/14/2010   hemorrhoids   DEEP NECK LYMPH NODE BIOPSY / EXCISION     rt neck   fracture left ankle     RETINAL DETACHMENT REPAIR W/ SCLERAL BUCKLE LE  04/2010    Family History  Problem Relation Age of Onset   Heart disease Mother        heart valve    Heart disease Father        late 98s, nonsmoker, chol over 300   Prostate cancer Father        late 75s   Hypertension Father    Arthritis Brother        psoriatic    Medications- reviewed and updated Current Outpatient Medications  Medication Sig Dispense Refill   clorazepate (TRANXENE) 3.75 MG tablet Take 3.75 mg by mouth 2 (two) times daily as needed for anxiety.     FLUoxetine (PROZAC) 20 MG tablet Take 20 mg by mouth daily.     gabapentin (NEURONTIN) 400 MG capsule Take 400 mg by mouth 2 (two) times daily.     Multiple Vitamin (MULTIVITAMIN) tablet Take 1 tablet by mouth daily.     temazepam (RESTORIL) 15 MG capsule Take 15 mg by mouth at bedtime.     No current facility-administered medications for this visit.    Allergies-reviewed and updated No Known Allergies  Social History   Social  History Narrative   Married Jan 1984. 2 children - son and daughter. 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       Hobbies: exercise, flips houses with nephew   Objective  Objective:  BP 112/72    Pulse 84    Temp 98.2 F (36.8 C)    Ht 6' (1.829 m)    Wt 180 lb 12.8 oz (82 kg)    SpO2 95%    BMI 24.52 kg/m  Gen: NAD, resting comfortably HEENT: Mucous membranes are moist. Oropharynx normal Neck: no thyromegaly CV: RRR no murmurs rubs or gallops Lungs: CTAB no crackles, wheeze, rhonchi Abdomen: soft/nontender/nondistended/normal bowel sounds. No rebound or guarding.  Ext: no edema Skin: warm, dry Neuro: grossly normal, moves all extremities, PERRLA   Assessment and Plan  60 y.o. male presenting for annual physical.  Health Maintenance counseling: 1. Anticipatory guidance: Patient counseled regarding regular dental exams -q6 months, eye exams- yearly ,  avoiding smoking and second hand smoke, limiting alcohol to 2 beverages per day- glass of red wine when gets home- then 8 pm shot of bourbon or two (normally  not more than 3 a night)- uses it to calm it down- doing a fast in January. Cant go back to sleep when he does this and stomach more unsettled/gassier (plus looser stool- causes itchiness and even as hemorrhoid now)- we discussed limiting max 2 per day.  No illicit drugs.  2. Risk factor reduction:  Advised patient of need for regular exercise and diet rich and fruits and vegetables to reduce risk of heart attack and stroke.  Exercise- down lately- goal 150 minutes a week.   Diet/weight management- down 4 lbs from last cpe!  Wants to cut out saturated fats like he did in past- lost 36 lbs.   Wt Readings from Last 3 Encounters:  01/09/21 180 lb 12.8 oz (82 kg)  01/06/20 184 lb (83.5 kg)  10/08/19 173 lb (78.5 kg)  3. Immunizations/screenings/ancillary studies DISCUSSED:  -COVID booster  vaccination #1 - declines   -Flu vaccination (last one 10/20) - declines Immunization History  Administered Date(s) Administered   Influenza Whole 11/01/2008   Influenza-Unspecified 10/15/2013, 11/18/2014, 10/19/2015, 10/13/2017, 10/03/2018   Td 01/02/2003   Tdap 02/18/2007, 05/23/2017   Zoster Recombinat (Shingrix) 07/02/2017, 09/12/2017   4. Prostate cancer screening-  patient with overall low risk prostate specific antigen trend-we will repeat today and continue to monitor.    Lab Results  Component Value Date   PSA 2.65 01/06/2020   PSA 2.21 07/16/2018   PSA 2.60 05/28/2018   5. Colon cancer screening - colonoscopy 07/14/10 with 10 year repeat planned with Dr.Gessner  6. Skin cancer screening- annual derm checks. advised regular sunscreen use. Denies worrisome, changing, or new skin lesions.  7. Smoking associated screening (lung cancer screening, AAA screen 65-75, UA)- former smoker- quit in 1980, would be a candidate for AAA screen at 59 but no regular screening required 8. STD screening - monogamous and declines  Status of chronic or acute concerns     #Rotator cuff tendinitis as noted October 2021 visit-recommended Aleve twice a day for 7 days, home exercise program and follow-up with Dr. Delilah Shan of orthopedics if no improvement.  1/22 visit, patient reported better but not as severe. Wanted to hold off on further follow up  - better today but bc not lifting- is going to restart and see how he does    #Orthostatic intolerance post COVID-20 December 2018-this occurred after patient exercised for the first time post Covid along with only eating 1 meal and being dehydrated.  We opted to pursue cardiac work-up only if recurrence-patient reported no recurrence since that time   # Anxiety-managed through psychiatry S:Medication: Prozac 20 mg daily, clorazepate for rare as needed use.  Also on gabapentin.  No SI today.  temazepam also for sleep.  -should avoid alcohol with tranxene and  temazepam- psych is aware -therapy not as helpful in the past A/P: not ideal control but is going to continue to work with psychiatry and continue current meds. We also discussed some faith based options to support him.    #hyperlipidemia S: Medication:None.  10-year ASCVD risk  6.6%. CAD in father but late 66s and lipids much higher Lab Results  Component Value Date   CHOL 255 (H) 01/06/2020   HDL 80.60 01/06/2020   LDLCALC 161 (H) 01/06/2020   LDLDIRECT 140.0 07/16/2018   TRIG 66.0 01/06/2020   CHOLHDL 3 01/06/2020   A/P: will update lipids- he wants to work on lifestyle and hold off on ct cardiac scoring or meds   #Sinus drainage each morning-years of  issues.  Felt like nose gets stopped up when he goes to bed and continues into the morning-denies other obvious allergic symptoms. Feels like has to swallow hard to get mucus down. More prominent lately. Dayquil helped when he took it once.   #Tinnitus- for a few years. Hearing tests at work in past but has not done recently. No pulsatile sensation. Constant ringing- worse when its quiet. Advised hearing testing at [facility]  #intermittent water "down windpipe" but admits to eating/drinking quickly and usually eats first then drinks fluids rapidly afterwards- discussed slowing slpeed on this.     Recommended follow up: Return in about 1 year (around 01/09/2022) for physical or sooner if needed. Future Appointments  Date Time Provider [place]  02/05/2022  8:00 AM Rankin, Clent Demark, MD RDE-RDE None   Lab/Order associations: fasting   ICD-10-CM   1. Preventative health care  Z00.00     2. Anxiety state  F41.1     3. Hyperlipidemia, unspecified hyperlipidemia type  E78.5     4. Screening for prostate cancer  Z12.5       No orders of the defined types were placed in this encounter.   I,Jada Bradford,acting as a scribe for Garret Reddish, MD.,have documented all relevant documentation on the behalf of Garret Reddish, MD,as  directed by  Garret Reddish, MD while in the presence of Garret Reddish, MD.  I, Garret Reddish, MD, have reviewed all documentation for this visit. The documentation on 01/09/21 for the exam, diagnosis, procedures, and orders are all accurate and complete.  Return precautions advised.  Garret Reddish, MD

## 2021-01-09 ENCOUNTER — Other Ambulatory Visit: Payer: Self-pay

## 2021-01-09 ENCOUNTER — Encounter: Payer: Self-pay | Admitting: Family Medicine

## 2021-01-09 ENCOUNTER — Ambulatory Visit (INDEPENDENT_AMBULATORY_CARE_PROVIDER_SITE_OTHER): Payer: 59 | Admitting: Family Medicine

## 2021-01-09 VITALS — BP 112/72 | HR 84 | Temp 98.2°F | Ht 72.0 in | Wt 180.8 lb

## 2021-01-09 DIAGNOSIS — Z125 Encounter for screening for malignant neoplasm of prostate: Secondary | ICD-10-CM

## 2021-01-09 DIAGNOSIS — E785 Hyperlipidemia, unspecified: Secondary | ICD-10-CM | POA: Diagnosis not present

## 2021-01-09 DIAGNOSIS — Z1211 Encounter for screening for malignant neoplasm of colon: Secondary | ICD-10-CM

## 2021-01-09 DIAGNOSIS — Z0000 Encounter for general adult medical examination without abnormal findings: Secondary | ICD-10-CM | POA: Diagnosis not present

## 2021-01-09 DIAGNOSIS — F411 Generalized anxiety disorder: Secondary | ICD-10-CM

## 2021-01-09 LAB — CBC WITH DIFFERENTIAL/PLATELET
Basophils Absolute: 0 10*3/uL (ref 0.0–0.1)
Basophils Relative: 0.4 % (ref 0.0–3.0)
Eosinophils Absolute: 0 10*3/uL (ref 0.0–0.7)
Eosinophils Relative: 0.5 % (ref 0.0–5.0)
HCT: 41.3 % (ref 39.0–52.0)
Hemoglobin: 13.5 g/dL (ref 13.0–17.0)
Lymphocytes Relative: 36.1 % (ref 12.0–46.0)
Lymphs Abs: 1.4 10*3/uL (ref 0.7–4.0)
MCHC: 32.6 g/dL (ref 30.0–36.0)
MCV: 97 fl (ref 78.0–100.0)
Monocytes Absolute: 0.5 10*3/uL (ref 0.1–1.0)
Monocytes Relative: 12 % (ref 3.0–12.0)
Neutro Abs: 2 10*3/uL (ref 1.4–7.7)
Neutrophils Relative %: 51 % (ref 43.0–77.0)
Platelets: 255 10*3/uL (ref 150.0–400.0)
RBC: 4.26 Mil/uL (ref 4.22–5.81)
RDW: 13.9 % (ref 11.5–15.5)
WBC: 3.8 10*3/uL — ABNORMAL LOW (ref 4.0–10.5)

## 2021-01-09 LAB — LIPID PANEL
Cholesterol: 223 mg/dL — ABNORMAL HIGH (ref 0–200)
HDL: 65 mg/dL (ref >39.00)
LDL Cholesterol: 136 mg/dL — ABNORMAL HIGH (ref 0–99)
NonHDL: 157.71
Total CHOL/HDL Ratio: 3
Triglycerides: 109 mg/dL (ref 0.0–149.0)
VLDL: 21.8 mg/dL (ref 0.0–40.0)

## 2021-01-09 LAB — COMPREHENSIVE METABOLIC PANEL
ALT: 14 U/L (ref 0–53)
AST: 21 U/L (ref 0–37)
Albumin: 4.1 g/dL (ref 3.5–5.2)
Alkaline Phosphatase: 45 U/L (ref 39–117)
BUN: 15 mg/dL (ref 6–23)
CO2: 30 mEq/L (ref 19–32)
Calcium: 9.6 mg/dL (ref 8.4–10.5)
Chloride: 107 mEq/L (ref 96–112)
Creatinine, Ser: 1.05 mg/dL (ref 0.40–1.50)
GFR: 76.8 mL/min (ref >60.00)
Glucose, Bld: 88 mg/dL (ref 70–99)
Potassium: 4.3 mEq/L (ref 3.5–5.1)
Sodium: 142 mEq/L (ref 135–145)
Total Bilirubin: 0.7 mg/dL (ref 0.2–1.2)
Total Protein: 6.9 g/dL (ref 6.0–8.3)

## 2021-01-09 LAB — PSA: PSA: 2.9 ng/mL (ref 0.10–4.00)

## 2021-01-11 ENCOUNTER — Other Ambulatory Visit: Payer: Self-pay

## 2021-01-11 DIAGNOSIS — R972 Elevated prostate specific antigen [PSA]: Secondary | ICD-10-CM

## 2021-01-23 ENCOUNTER — Encounter: Payer: Self-pay | Admitting: Internal Medicine

## 2021-02-02 ENCOUNTER — Encounter (INDEPENDENT_AMBULATORY_CARE_PROVIDER_SITE_OTHER): Payer: 59 | Admitting: Ophthalmology

## 2021-02-20 ENCOUNTER — Ambulatory Visit (AMBULATORY_SURGERY_CENTER): Payer: 59 | Admitting: *Deleted

## 2021-02-20 ENCOUNTER — Other Ambulatory Visit: Payer: Self-pay

## 2021-02-20 VITALS — Ht 72.0 in | Wt 180.0 lb

## 2021-02-20 DIAGNOSIS — Z1211 Encounter for screening for malignant neoplasm of colon: Secondary | ICD-10-CM

## 2021-02-20 NOTE — Progress Notes (Signed)
Patient's pre-visit was done today over the phone with the patient. Name,DOB and address verified. Patient denies any allergies to Eggs and Soy. Patient denies any problems with anesthesia/sedation. Patient is not taking any diet pills or blood thinners. No home Oxygen.   Prep instructions sent to pt's MyChart-pt aware. Patient understands to call us back with any questions or concerns. Patient is aware of our care-partner policy and [id number] safety protocol.   EMMI education assigned to the patient for the procedure, sent to [place].

## 2021-03-10 ENCOUNTER — Encounter: Payer: 59 | Admitting: Internal Medicine

## 2021-03-13 ENCOUNTER — Other Ambulatory Visit (INDEPENDENT_AMBULATORY_CARE_PROVIDER_SITE_OTHER): Payer: 59

## 2021-03-13 DIAGNOSIS — R972 Elevated prostate specific antigen [PSA]: Secondary | ICD-10-CM

## 2021-03-13 LAB — PSA: PSA: 2.91 ng/mL (ref 0.10–4.00)

## 2021-03-24 ENCOUNTER — Encounter: Payer: 59 | Admitting: Internal Medicine

## 2021-04-01 ENCOUNTER — Encounter: Payer: Self-pay | Admitting: Internal Medicine

## 2021-04-10 ENCOUNTER — Ambulatory Visit (AMBULATORY_SURGERY_CENTER): Payer: 59 | Admitting: Internal Medicine

## 2021-04-10 ENCOUNTER — Encounter: Payer: Self-pay | Admitting: Internal Medicine

## 2021-04-10 VITALS — BP 123/78 | HR 56 | Temp 97.1°F | Resp 12 | Ht 72.0 in | Wt 180.0 lb

## 2021-04-10 DIAGNOSIS — L290 Pruritus ani: Secondary | ICD-10-CM

## 2021-04-10 DIAGNOSIS — Z1211 Encounter for screening for malignant neoplasm of colon: Secondary | ICD-10-CM | POA: Diagnosis not present

## 2021-04-10 MED ORDER — SODIUM CHLORIDE 0.9 % IV SOLN: 500.0000 mL | Freq: Once | INTRAVENOUS | Status: DC

## 2021-04-10 NOTE — Progress Notes (Signed)
VS  DT ? ?Pt's states no medical or surgical changes since previsit or office visit. ? ?

## 2021-04-10 NOTE — Progress Notes (Signed)
[facility] History and Physical ? ? ?Primary Care Physician:  Marin Olp, MD ? ? ?Reason for Procedure:   CRCa screen ? ?Plan:    colonoscopy ? ? ? ? ?HPI: Alexander Gonzales is a 61 y.o. male here for screening colonoscopy ? ? ?Past Medical History:  ?Diagnosis Date  ? Anxiety   ? Depression   ? Detached retina   ? April 2012  ? ? ?Past Surgical History:  ?Procedure Laterality Date  ? APPENDECTOMY    ? COLONOSCOPY  07/14/2010  ? hemorrhoids  ? DEEP NECK LYMPH NODE BIOPSY / EXCISION    ? rt neck  ? fracture left ankle    ? RETINAL DETACHMENT REPAIR W/ SCLERAL BUCKLE LE  04/2010  ? ? ?Prior to Admission medications   ?Medication Sig Start Date End Date Taking? Authorizing Provider  ?clorazepate (TRANXENE) 3.75 MG tablet Take 3.75 mg by mouth 2 (two) times daily as needed for anxiety.   Yes [provider]  ?FLUoxetine (PROZAC) 20 MG tablet Take 20 mg by mouth daily.   Yes [provider]  ?gabapentin (NEURONTIN) 400 MG capsule Take 400 mg by mouth 2 (two) times daily.   Yes [provider]  ?Multiple Vitamin (MULTIVITAMIN) tablet Take 1 tablet by mouth daily.   Yes [provider]  ?temazepam (RESTORIL) 15 MG capsule Take 15 mg by mouth at bedtime. 09/24/19  Yes [provider]  ? ? ?Current Outpatient Medications  ?Medication Sig Dispense Refill  ? clorazepate (TRANXENE) 3.75 MG tablet Take 3.75 mg by mouth 2 (two) times daily as needed for anxiety.    ? FLUoxetine (PROZAC) 20 MG tablet Take 20 mg by mouth daily.    ? gabapentin (NEURONTIN) 400 MG capsule Take 400 mg by mouth 2 (two) times daily.    ? Multiple Vitamin (MULTIVITAMIN) tablet Take 1 tablet by mouth daily.    ? temazepam (RESTORIL) 15 MG capsule Take 15 mg by mouth at bedtime.    ? ?Current Facility-Administered Medications  ?Medication Dose Route Frequency Provider Last Rate Last Admin  ? 0.9 %  sodium chloride infusion  500 mL Intravenous Once Gatha Mayer, MD      ? ? ?Allergies as of  04/10/2021  ? (No Known Allergies)  ? ? ?Family History  ?Problem Relation Age of Onset  ? Heart disease Mother   ?     heart valve   ? Heart disease Father   ?     late 62s, nonsmoker, chol over 300  ? Prostate cancer Father   ?     late 26s  ? Hypertension Father   ? Arthritis Brother   ?     psoriatic  ? Colon cancer Neg Hx   ? Colon polyps Neg Hx   ? ? ?Social History  ? ?Socioeconomic History  ? Marital status: Married  ?  Spouse name: Not on file  ? Number of children: Not on file  ? Years of education: Not on file  ? Highest education level: Not on file  ?Occupational History  ? Not on file  ?Tobacco Use  ? Smoking status: Former  ?  Types: Cigarettes, Cigars  ?  Quit date: 05/10/1978  ?  Years since quitting: 42.9  ? Smokeless tobacco: Never  ? Tobacco comments:  ?  random cigarette with beer in teenage years, very rare cigar once in 3 months  ?Vaping Use  ? Vaping Use: Never used  ?Substance and Sexual  Activity  ? Alcohol use: Yes  ?  Alcohol/week: 2.0 - 3.0 standard drinks  ?  Types: 2 - 3 Cans of beer per week  ? Drug use: No  ? Sexual activity: Yes  ?Other Topics Concern  ? Not on file  ?Social History Narrative  ? Married Jan 1984. 2 children - son and daughter. 4 grandkids (two 60 year olds and two one year olds in 2019)  ?   ? 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   ?   ? Hobbies: exercise, flips houses with nephew  ? ?Social Determinants of Health  ? ?Financial Resource Strain: Not on file  ?Food Insecurity: Not on file  ?Transportation Needs: Not on file  ?Physical Activity: Not on file  ?Stress: Not on file  ?Social Connections: Not on file  ?Intimate Partner Violence: Not on file  ? ? ?Review of Systems: ?All other review of systems negative except as mentioned in the HPI. ? ?Physical Exam: ?Vital signs ?BP 116/81   Pulse 73   Temp (!) 97.1 ?F (36.2 ?C)   Resp 15   Ht 6' (1.829 m)   Wt 180 lb (81.6 kg)   SpO2 98%   BMI 24.41 kg/m?  ? ?General:   Alert,   Well-developed, well-nourished, pleasant and cooperative in NAD ?Lungs:  Clear throughout to auscultation.   ?Heart:  Regular rate and rhythm; no murmurs, clicks, rubs,  or gallops. ?Abdomen:  Soft, nontender and nondistended. Normal bowel sounds.   ?Neuro/Psych:  Alert and cooperative. Normal mood and affect. A and O x 3 ? ? ?'@Chaske Paskett'$  Simonne Maffucci, MD, [facility] ?[facility] ?[phone] (pager) ?04/10/2021 7:59 AM@ ? ?

## 2021-04-10 NOTE — Op Note (Signed)
[facility] ?Patient Name: Alexander Gonzales ?Procedure Date: 04/10/2021 7:26 AM ?MRN: [id number] ?Endoscopist: Gatha Mayer , MD ?Age: 61 ?Referring MD:  ?Date of Birth: Feb 27, 1960 ?Gender: Male ?Account #: [id number] ?Procedure:                Colonoscopy ?Indications:              Screening for colorectal malignant neoplasm, Last  ?                          colonoscopy: 2012 ?Medicines:                Monitored Anesthesia Care ?Procedure:                Pre-Anesthesia Assessment: ?                          - Prior to the procedure, a History and Physical  ?                          was performed, and patient medications and  ?                          allergies were reviewed. The patient's tolerance of  ?                          previous anesthesia was also reviewed. The risks  ?                          and benefits of the procedure and the sedation  ?                          options and risks were discussed with the patient.  ?                          All questions were answered, and informed consent  ?                          was obtained. Prior Anticoagulants: The patient has  ?                          taken no previous anticoagulant or antiplatelet  ?                          agents. ASA Grade Assessment: II - A patient with  ?                          mild systemic disease. After reviewing the risks  ?                          and benefits, the patient was deemed in  ?                          satisfactory condition to undergo the procedure. ?  After obtaining informed consent, the colonoscope  ?                          was passed under direct vision. Throughout the  ?                          procedure, the patient's blood pressure, pulse, and  ?                          oxygen saturations were monitored continuously. The  ?                          CF [id number] #[id number] was introduced through the anus  ?                          and advanced to the the cecum,  identified by  ?                          appendiceal orifice and ileocecal valve. The  ?                          colonoscopy was performed without difficulty. The  ?                          patient tolerated the procedure well. The quality  ?                          of the bowel preparation was good. The ileocecal  ?                          valve, appendiceal orifice, and rectum were  ?                          photographed. The bowel preparation used was  ?                          Miralax via split dose instruction. ?Scope In: 8:06:23 AM ?Scope Out: 8:29:48 AM ?Scope Withdrawal Time: 0 hours 18 minutes 22 seconds  ?Total Procedure Duration: 0 hours 23 minutes 25 seconds  ?Findings:                 The perianal exam findings include a perianal rash. ?                          The digital rectal exam was normal. Pertinent  ?                          negatives include normal prostate (size, shape, and  ?                          consistency). ?                          The entire examined colon appeared normal on direct  ?  and retroflexion views. ?Complications:            No immediate complications. ?Estimated Blood Loss:     Estimated blood loss: none. ?Impression:               - Perianal rash found on perianal exam. ?                          - The entire examined colon is normal on direct and  ?                          retroflexion views. ?                          - No specimens collected. ?Recommendation:           - Patient has a contact number available for  ?                          emergencies. The signs and symptoms of potential  ?                          delayed complications were discussed with the  ?                          patient. Return to normal activities tomorrow.  ?                          Written discharge instructions were provided to the  ?                          patient. ?                          - Repeat colonoscopy in 10 years for screening  ?                           purposes. ?                          - suggestions to treat perianal rash/itching in AVS ?Gatha Mayer, MD ?04/10/2021 8:39:07 AM ?This report has been signed electronically. ?

## 2021-04-10 NOTE — Progress Notes (Signed)
A and O x3. Report to RN. Tolerated MAC anesthesia well. 

## 2021-04-10 NOTE — Patient Instructions (Addendum)
No polyps or cancer were seen. ? ?Normal exam. ? ?There was some slight red or pink discoloration of the perianal skin. Perhaps this is related to the itching though can be seen after colonoscopy prep diarrhea. ? ?I suggest trying the following: ? ?1) Use a hair dryer on cool to dry after bathing - try to avoid aggressive wiping ? ?2) There are "toilet paper sprays" that can be purchased ([place]) - Pristine and Cornelia are examples ? ?3) Desitin or Calmoseptine ointments may help. Calmoseptine may have to be ordered by the pharmacy or on line. ? ?I appreciate the opportunity to care for you. ?Gatha Mayer, MD, [facility] ? ? ?YOU HAD AN ENDOSCOPIC PROCEDURE TODAY AT [facility]:   Refer to the procedure report that was given to you for any specific questions about what was found during the examination.  If the procedure report does not answer your questions, please call your gastroenterologist to clarify.  If you requested that your care partner not be given the details of your procedure findings, then the procedure report has been included in a sealed envelope for you to review at your convenience later. ? ?YOU SHOULD EXPECT: Some feelings of bloating in the abdomen. Passage of more gas than usual.  Walking can help get rid of the air that was put into your GI tract during the procedure and reduce the bloating. If you had a lower endoscopy (such as a colonoscopy or flexible sigmoidoscopy) you may notice spotting of blood in your stool or on the toilet paper. If you underwent a bowel prep for your procedure, you may not have a normal bowel movement for a few days. ? ?Please Note:  You might notice some irritation and congestion in your nose or some drainage.  This is from the oxygen used during your procedure.  There is no need for concern and it should clear up in a day or so. ? ?SYMPTOMS TO REPORT IMMEDIATELY: ? ?Following lower endoscopy (colonoscopy or flexible sigmoidoscopy): ? Excessive amounts  of blood in the stool ? Significant tenderness or worsening of abdominal pains ? Swelling of the abdomen that is new, acute ? Fever of 100?F or higher ? ?Following upper endoscopy (EGD) ?For urgent or emergent issues, a gastroenterologist can be reached at any hour by calling [phone]. ?Do not use MyChart messaging for urgent concerns.  ? ? ?DIET:  We do recommend a small meal at first, but then you may proceed to your regular diet.  Drink plenty of fluids but you should avoid alcoholic beverages for 24 hours. ? ?ACTIVITY:  You should plan to take it easy for the rest of today and you should NOT DRIVE or use heavy machinery until tomorrow (because of the sedation medicines used during the test).   ? ?FOLLOW UP: ?Our staff will call the number listed on your records 48-72 hours following your procedure to check on you and address any questions or concerns that you may have regarding the information given to you following your procedure. If we do not reach you, we will leave a message.  We will attempt to reach you two times.  During this call, we will ask if you have developed any symptoms of COVID 19. If you develop any symptoms (ie: fever, flu-like symptoms, shortness of breath, cough etc.) before then, please call [phone].  If you test positive for Covid 19 in the 2 weeks post procedure, please call and report this information to [place].   ? ?  If any biopsies were taken you will be contacted by phone or by letter within the next 1-3 weeks.  Please call us at [phone] if you have not heard about the biopsies in 3 weeks.  ? ? ?SIGNATURES/CONFIDENTIALITY: ?You and/or your care partner have signed paperwork which will be entered into your electronic medical record.  These signatures attest to the fact that that the information above on your After Visit Summary has been reviewed and is understood.  Full responsibility of the confidentiality of this discharge information lies with you and/or your  care-partner.  ?

## 2021-04-12 ENCOUNTER — Telehealth: Payer: Self-pay | Admitting: *Deleted

## 2021-04-12 ENCOUNTER — Telehealth: Payer: Self-pay

## 2021-04-12 NOTE — Telephone Encounter (Signed)
Follow up call placed, VM obtained and mailbox not set up. ?SChaplin, RN,BSN ? ?

## 2021-04-12 NOTE — Telephone Encounter (Signed)
?  Follow up Call- ? ? ?  04/10/2021  ?  7:16 AM  ?Call back number  ?Post procedure Call Back phone  # [phone]  ?Permission to leave phone message Yes  ?  ? ?Patient questions: ? ?Do you have a fever, pain , or abdominal swelling? No. ?Pain Score  0 * ? ?Have you tolerated food without any problems? Yes.   ? ?Have you been able to return to your normal activities? Yes.   ? ?Do you have any questions about your discharge instructions: ?Diet   No. ?Medications  No. ?Follow up visit  No. ? ?Do you have questions or concerns about your Care? No. ? ?Actions: ?* If pain score is 4 or above: ?No action needed, pain <4. ? ? ?

## 2021-09-25 ENCOUNTER — Encounter: Payer: Self-pay | Admitting: *Deleted

## 2021-12-14 ENCOUNTER — Encounter: Payer: Self-pay | Admitting: *Deleted

## 2022-01-11 ENCOUNTER — Encounter: Payer: Self-pay | Admitting: Family Medicine

## 2022-01-11 ENCOUNTER — Ambulatory Visit (INDEPENDENT_AMBULATORY_CARE_PROVIDER_SITE_OTHER): Payer: 59 | Admitting: Family Medicine

## 2022-01-11 VITALS — BP 100/72 | HR 80 | Temp 97.6°F | Ht 72.0 in | Wt 187.4 lb

## 2022-01-11 DIAGNOSIS — Z0000 Encounter for general adult medical examination without abnormal findings: Secondary | ICD-10-CM | POA: Diagnosis not present

## 2022-01-11 DIAGNOSIS — E785 Hyperlipidemia, unspecified: Secondary | ICD-10-CM

## 2022-01-11 DIAGNOSIS — Z125 Encounter for screening for malignant neoplasm of prostate: Secondary | ICD-10-CM

## 2022-01-11 LAB — COMPREHENSIVE METABOLIC PANEL
ALT: 24 U/L (ref 0–53)
AST: 41 U/L — ABNORMAL HIGH (ref 0–37)
Albumin: 4.4 g/dL (ref 3.5–5.2)
Alkaline Phosphatase: 49 U/L (ref 39–117)
BUN: 16 mg/dL (ref 6–23)
CO2: 28 mEq/L (ref 19–32)
Calcium: 9.7 mg/dL (ref 8.4–10.5)
Chloride: 108 mEq/L (ref 96–112)
Creatinine, Ser: 1.15 mg/dL (ref 0.40–1.50)
GFR: 68.37 mL/min (ref >60.00)
Glucose, Bld: 93 mg/dL (ref 70–99)
Potassium: 5.3 mEq/L — ABNORMAL HIGH (ref 3.5–5.1)
Sodium: 144 mEq/L (ref 135–145)
Total Bilirubin: 0.4 mg/dL (ref 0.2–1.2)
Total Protein: 7.1 g/dL (ref 6.0–8.3)

## 2022-01-11 LAB — CBC WITH DIFFERENTIAL/PLATELET
Basophils Absolute: 0 10*3/uL (ref 0.0–0.1)
Basophils Relative: 0.4 % (ref 0.0–3.0)
Eosinophils Absolute: 0 10*3/uL (ref 0.0–0.7)
Eosinophils Relative: 0.7 % (ref 0.0–5.0)
HCT: 42 % (ref 39.0–52.0)
Hemoglobin: 14.2 g/dL (ref 13.0–17.0)
Lymphocytes Relative: 32.8 % (ref 12.0–46.0)
Lymphs Abs: 1.3 10*3/uL (ref 0.7–4.0)
MCHC: 33.7 g/dL (ref 30.0–36.0)
MCV: 98.1 fl (ref 78.0–100.0)
Monocytes Absolute: 0.5 10*3/uL (ref 0.1–1.0)
Monocytes Relative: 12.6 % — ABNORMAL HIGH (ref 3.0–12.0)
Neutro Abs: 2 10*3/uL (ref 1.4–7.7)
Neutrophils Relative %: 53.5 % (ref 43.0–77.0)
Platelets: 285 10*3/uL (ref 150.0–400.0)
RBC: 4.28 Mil/uL (ref 4.22–5.81)
RDW: 14.3 % (ref 11.5–15.5)
WBC: 3.8 10*3/uL — ABNORMAL LOW (ref 4.0–10.5)

## 2022-01-11 LAB — LIPID PANEL
Cholesterol: 256 mg/dL — ABNORMAL HIGH (ref 0–200)
HDL: 70.2 mg/dL (ref >39.00)
LDL Cholesterol: 171 mg/dL — ABNORMAL HIGH (ref 0–99)
NonHDL: 186.19
Total CHOL/HDL Ratio: 4
Triglycerides: 76 mg/dL (ref 0.0–149.0)
VLDL: 15.2 mg/dL (ref 0.0–40.0)

## 2022-01-11 LAB — PSA: PSA: 2.84 ng/mL (ref 0.10–4.00)

## 2022-01-11 NOTE — Patient Instructions (Addendum)
Please stop by lab before you go If you have mychart- we will send your results within 3 business days of [place] receiving them.  If you do not have mychart- we will call you about results within 5 business days of [place] receiving them.  *please also note that you will see labs on mychart as soon as they post. I will later go in and write notes on them- will say "notes from Dr. Yong Channel"   Consider updating hearing [facility] at [facility]  Recommended follow up: Return in about 1 year (around 01/12/2023) for physical or sooner if needed.Schedule b4 you leave.

## 2022-01-11 NOTE — Progress Notes (Signed)
Phone: [phone]   Subjective:  Patient presents today for their annual physical. Chief complaint-noted.   See problem oriented charting- ROS- full  review of systems was completed and negative  except for: still with morning congestion- swallowing issues with this but once clears fine- no daytime issues, stable tinnitus but constant, constipation- incomplete voiding- has to wipe a lot , mild intermittent shortness of breath - such as carrying something heavy up stairs- after finishes feels winded or  if drank full bottle of water may feel winded  The following were reviewed and entered/updated in epic: Past Medical History:  Diagnosis Date   Anxiety    Depression    Detached retina    April 2012   Patient Active Problem List   Diagnosis Date Noted   History of retinal detachment 02/03/2020    Priority: Medium    Erectile dysfunction 02/28/2015    Priority: Medium    Anxiety state 10/22/2006    Priority: Medium    Retinal scar 02/03/2020    Priority: Low   Left epiretinal membrane 02/03/2020    Priority: Low   Past Surgical History:  Procedure Laterality Date   APPENDECTOMY     COLONOSCOPY  07/14/2010   hemorrhoids   DEEP NECK LYMPH NODE BIOPSY / EXCISION     rt neck   fracture left ankle     RETINAL DETACHMENT REPAIR W/ SCLERAL BUCKLE LE  04/2010    Family History  Problem Relation Age of Onset   Heart disease Mother        heart valve    Heart disease Father        late 29s, nonsmoker, chol over 300   Prostate cancer Father        late 107s   Hypertension Father    Arthritis Brother        psoriatic   Colon cancer Neg Hx    Colon polyps Neg Hx     Medications- reviewed and updated Current Outpatient Medications  Medication Sig Dispense Refill   clorazepate (TRANXENE) 3.75 MG tablet Take 3.75 mg by mouth 2 (two) times daily as needed for anxiety.     FLUoxetine (PROZAC) 20 MG tablet Take 20 mg by mouth daily.     gabapentin (NEURONTIN) 400 MG capsule  Take 400 mg by mouth 2 (two) times daily.     Multiple Vitamin (MULTIVITAMIN) tablet Take 1 tablet by mouth daily.     temazepam (RESTORIL) 15 MG capsule Take 15 mg by mouth at bedtime.     No current facility-administered medications for this visit.    Allergies-reviewed and updated No Known Allergies  Social History   Social History Narrative   Married Jan 1984. 2 children - son and daughter. 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       Hobbies: exercise, flips houses with nephew   Objective  Objective:  BP 100/72   Pulse 80   Temp 97.6 F (36.4 C)   Ht 6' (1.829 m)   Wt 187 lb 6.4 oz (85 kg)   SpO2 99%   BMI 25.42 kg/m  Gen: NAD, resting comfortably HEENT: Mucous membranes are moist. Oropharynx normal Neck: no thyromegaly CV: RRR no murmurs rubs or gallops Lungs: CTAB no crackles, wheeze, rhonchi Abdomen: soft/nontender/nondistended/normal bowel sounds. No rebound or guarding.  Ext: no edema Skin: warm, dry Neuro: grossly normal,  moves all extremities, PERRLA   Assessment and Plan  62 y.o. male presenting for annual physical.  Health Maintenance counseling: 1. Anticipatory guidance: Patient counseled regarding regular dental exams -q6 months, eye exams -yearly,  avoiding smoking and second hand smoke, limiting alcohol to 2 beverages per day -doing another dry January- down to 2 a day otherwise- feels like stomach upset morning after eating and needs to use BM- bowels firm up off alcohol- encouraged increasing hydration, no illicit drugs .   2. Risk factor reduction:  Advised patient of need for regular exercise and diet rich and fruits and vegetables to reduce risk of heart attack and stroke.  Exercise- going to gym 3-4 days a week starts out cardio and working on staying toned with lifting weights and doing core.  Diet/weight management-weight up 7 pounds from last year-has  lost significant weight in the past- wants to cut down on alcohol- also trying to improve diet - he and wife working together-wants to cut saturated fats.  Wt Readings from Last 3 Encounters:  01/11/22 187 lb 6.4 oz (85 kg)  04/10/21 180 lb (81.6 kg)  02/20/21 180 lb (81.6 kg)  3. Immunizations/screenings/ancillary studies declines COVID and flu shotas well as RSV Immunization History  Administered Date(s) Administered   Influenza Whole 11/01/2008   Influenza-Unspecified 10/15/2013, 11/18/2014, 10/19/2015, 10/13/2017, 10/03/2018   Td 01/02/2003   Tdap 02/18/2007, 05/23/2017   Zoster Recombinat (Shingrix) 07/02/2017, 09/12/2017   4. Prostate cancer screening- overall low risk PSA trend-continue to monitor with labs Lab Results  Component Value Date   PSA 2.91 03/13/2021   PSA 2.90 01/09/2021   PSA 2.65 01/06/2020   5. Colon cancer screening - 04/10/21 with 10 year repeat 6. Skin cancer screening-annual dermatology visits. advised regular sunscreen use. Denies worrisome, changing, or new skin lesions-a little seborrheic dermatitis in creases of nose- he is going to try lubrication or short term hydrocortisone 7. Smoking associated screening (lung cancer screening, AAA screen 65-75, UA)-former smoker- quit 1980. AAA screening at 36 planned but otherwise no regular follow-up 8. STD screening - not needed as monogamous  Status of chronic or acute concerns   #social update- retiring in march- extended vacation now!   # Anxiety-continues to be managed by psychiatry on clorazepate, fluoxetine, gabapentin, temazepam- working well  # Orthostatic intolerance post COVID-20 December 2018-plan was to pursue workup only if recurrence but no recurrence  #hyperlipidemia S: Medication: None CAD in father that late 31s and lipids much higher The 10-year ASCVD risk score (Arnett DK, et al., 2019) is: 6.1%  Lab Results  Component Value Date   CHOL 223 (H) 01/09/2021   HDL 65.00 01/09/2021   LDLCALC  136 (H) 01/09/2021   LDLDIRECT 140.0 07/16/2018   TRIG 109.0 01/09/2021   CHOLHDL 3 01/09/2021   A/P: Update lipids and get ct calcium/cardiac scoring baseline with fmaily history  # Sinus drainage each morning-years of issues but no recent worsening-wants to monitor . No worse.   # Tinnitus-several years of issues.  Not pulsatile.  Constant ringing sound.  I recommended [facility] hearing test (again today) but has been tested in the past for hearing issues  Recommended follow up: Return in about 1 year (around 01/12/2023) for physical or sooner if needed.Schedule b4 you leave. Future Appointments  Date Time Provider [place]  02/05/2022  8:00 AM Rankin, Clent Demark, MD [facility]-RDE None   Lab/Order associations: fasting   ICD-10-CM   1. Preventative health care  Z00.00 CBC with Differential/Platelet  Comprehensive metabolic panel    Lipid panel    PSA    CT CARDIAC SCORING ([facility] LOCATIONS ONLY)    2. Hyperlipidemia, unspecified hyperlipidemia type  E78.5 CBC with Differential/Platelet    Comprehensive metabolic panel    Lipid panel    CT CARDIAC SCORING ([facility] LOCATIONS ONLY)    3. Screening for prostate cancer  Z12.5 PSA      No orders of the defined types were placed in this encounter.   Return precautions advised.  Garret Reddish, MD

## 2022-02-05 ENCOUNTER — Encounter (INDEPENDENT_AMBULATORY_CARE_PROVIDER_SITE_OTHER): Payer: 59 | Admitting: Ophthalmology

## 2022-02-12 ENCOUNTER — Ambulatory Visit
Admission: RE | Admit: 2022-02-12 | Discharge: 2022-02-12 | Disposition: A | Discharge status: Home / Self Care | Payer: 59 | Source: Ambulatory Visit | Attending: Family Medicine | Admitting: Family Medicine

## 2022-02-12 ENCOUNTER — Encounter: Payer: Self-pay | Admitting: Family Medicine

## 2022-02-12 DIAGNOSIS — Z0000 Encounter for general adult medical examination without abnormal findings: Secondary | ICD-10-CM

## 2022-02-12 DIAGNOSIS — E785 Hyperlipidemia, unspecified: Secondary | ICD-10-CM

## 2022-02-19 ENCOUNTER — Telehealth (INDEPENDENT_AMBULATORY_CARE_PROVIDER_SITE_OTHER): Payer: 59 | Admitting: Family Medicine

## 2022-02-19 ENCOUNTER — Encounter: Payer: Self-pay | Admitting: Family Medicine

## 2022-02-19 DIAGNOSIS — I700 Atherosclerosis of aorta: Secondary | ICD-10-CM | POA: Diagnosis not present

## 2022-02-19 DIAGNOSIS — E785 Hyperlipidemia, unspecified: Secondary | ICD-10-CM | POA: Diagnosis not present

## 2022-02-19 NOTE — Patient Instructions (Addendum)
Please go to [facility] central lab (updated 02/26/2019) in about 3 months - located [address] across the [address] from [facility] - in the basement - Hours: 7:30-5:30 PM M-F. You do NOT need an appointment.    [email or web address]  Limit alcohol to help with cholesterol some  Recommended follow up: Return for next already scheduled visit or sooner if needed.

## 2022-02-19 NOTE — Assessment & Plan Note (Signed)
ct calcium scoring of 72.9 in 2024 at 57%

## 2022-02-19 NOTE — Progress Notes (Signed)
Phone [phone] Virtual visit via Video note   Subjective:  Chief complaint: Chief Complaint  Patient presents with   Discuss CT scan results    This visit type was conducted due to national recommendations for restrictions regarding the COVID-19 Pandemic (e.g. social distancing).  This format is felt to be most appropriate for this patient at this time balancing risks to patient and risks to population by having him in for in person visit.  No physical exam was performed (except for noted visual exam or audio findings with Telehealth visits).    Our team/I connected with Alexander Gonzales at 11:20 AM EST by a video enabled telemedicine application (doxy.me or caregility through epic) and verified that I am speaking with the correct person using two identifiers.  Location patient: [facility] Location provider: [facility], office Persons participating in the virtual visit:  patient  Our team/I discussed the limitations of evaluation and management by telemedicine and the availability of in person appointments. In light of current covid-19 pandemic, patient also understands that we are trying to protect them by minimizing in office contact if at all possible.  The patient expressed consent for telemedicine visit and agreed to proceed. Patient understands insurance will be billed.   Past Medical History-  Patient Active Problem List   Diagnosis Date Noted   Aortic atherosclerosis ([place]) 02/19/2022    Priority: High   Hyperlipemia 02/19/2022    Priority: Medium    History of retinal detachment 02/03/2020    Priority: Medium    Erectile dysfunction 02/28/2015    Priority: Medium    Anxiety state 10/22/2006    Priority: Medium    Retinal scar 02/03/2020    Priority: Low   Left epiretinal membrane 02/03/2020    Priority: Low    Medications- reviewed and updated Current Outpatient Medications  Medication Sig Dispense Refill   clorazepate (TRANXENE) 3.75 MG tablet Take 3.75 mg by  mouth 2 (two) times daily as needed for anxiety.     FLUoxetine (PROZAC) 20 MG tablet Take 20 mg by mouth daily.     gabapentin (NEURONTIN) 400 MG capsule Take 400 mg by mouth 2 (two) times daily.     Multiple Vitamin (MULTIVITAMIN) tablet Take 1 tablet by mouth daily.     temazepam (RESTORIL) 15 MG capsule Take 15 mg by mouth at bedtime.     No current facility-administered medications for this visit.     Objective:  no self reported vitals Gen: NAD, resting comfortably Lungs: nonlabored, normal respiratory rate  Skin: appears dry, no obvious rash     Assessment and Plan   #hyperlipidemia- ct calcium scoring of 72.9 in 2024 at 57% # Aortic atherosclerosis S: Medication: None -vegetables, fruits, limiting red meat- mainly fish and chicken/turkey. Trying to limit saturated fat- had started this in January but went on cruise before he had labwork done- has improved again afterwards.  -has gotten LDL as low as 111 seven years ago- wants to work on dietary change and weight loss -1-2 glasses of wine considering 3 a week Lab Results  Component Value Date   CHOL 256 (H) 01/11/2022   HDL 70.20 01/11/2022   LDLCALC 171 (H) 01/11/2022   LDLDIRECT 140.0 07/16/2018   TRIG 76.0 01/11/2022   CHOLHDL 4 01/11/2022  A/P: Patient with aortic atherosclerosis as well as hyperlipidemia and known coronary artery calcium -We discussed options including statins, vegan diet, or more immediate step like Mediterranean/whole food plant-based diet - Patient would like to start with  improving his diet and reducing alcohol intake and recheck in 3 months - Labs were ordered including CMP and lipid panel and he can get [facility] at [facility] in 3 months without an appointment  Recommended follow up: -Otherwise we will reconvene our discussions January 2025 for his physical Future Appointments  Date Time Provider [place]  01/15/2023  9:20 AM Marin Olp, MD [facility]    Lab/Order  associations:   ICD-10-CM   1. Hyperlipidemia, unspecified hyperlipidemia type  E78.5 Lipid panel    Comprehensive metabolic panel    2. Aortic atherosclerosis (HCC)  I70.0      Time Spent: 25 minutes of total time (12:03 PM-12:28 PM) was spent on the date of the encounter performing the following actions: chart review prior to seeing the patient, obtaining history, counseling on the treatment and dietary options/plan, placing orders, and documenting in our EHR.   Return precautions advised.  Garret Reddish, MD

## 2022-05-24 ENCOUNTER — Other Ambulatory Visit (INDEPENDENT_AMBULATORY_CARE_PROVIDER_SITE_OTHER): Payer: 59

## 2022-05-24 ENCOUNTER — Encounter: Payer: Self-pay | Admitting: Family Medicine

## 2022-05-24 DIAGNOSIS — E785 Hyperlipidemia, unspecified: Secondary | ICD-10-CM

## 2022-05-24 LAB — COMPREHENSIVE METABOLIC PANEL
ALT: 13 U/L (ref 0–53)
AST: 23 U/L (ref 0–37)
Albumin: 4 g/dL (ref 3.5–5.2)
Alkaline Phosphatase: 60 U/L (ref 39–117)
BUN: 15 mg/dL (ref 6–23)
CO2: 27 mEq/L (ref 19–32)
Calcium: 9.4 mg/dL (ref 8.4–10.5)
Chloride: 103 mEq/L (ref 96–112)
Creatinine, Ser: 1.04 mg/dL (ref 0.40–1.50)
GFR: 76.95 mL/min (ref >60.00)
Glucose, Bld: 83 mg/dL (ref 70–99)
Potassium: 4.2 mEq/L (ref 3.5–5.1)
Sodium: 138 mEq/L (ref 135–145)
Total Bilirubin: 0.5 mg/dL (ref 0.2–1.2)
Total Protein: 7.1 g/dL (ref 6.0–8.3)

## 2022-05-24 LAB — LIPID PANEL
Cholesterol: 200 mg/dL (ref 0–200)
HDL: 59.9 mg/dL (ref >39.00)
LDL Cholesterol: 118 mg/dL — ABNORMAL HIGH (ref 0–99)
NonHDL: 139.95
Total CHOL/HDL Ratio: 3
Triglycerides: 112 mg/dL (ref 0.0–149.0)
VLDL: 22.4 mg/dL (ref 0.0–40.0)

## 2023-01-15 ENCOUNTER — Ambulatory Visit (INDEPENDENT_AMBULATORY_CARE_PROVIDER_SITE_OTHER): Payer: 59 | Admitting: Family Medicine

## 2023-01-15 ENCOUNTER — Encounter: Payer: Self-pay | Admitting: Family Medicine

## 2023-01-15 VITALS — BP 118/78 | HR 84 | Temp 97.2°F | Ht 72.0 in | Wt 171.0 lb

## 2023-01-15 DIAGNOSIS — E785 Hyperlipidemia, unspecified: Secondary | ICD-10-CM

## 2023-01-15 DIAGNOSIS — Z125 Encounter for screening for malignant neoplasm of prostate: Secondary | ICD-10-CM

## 2023-01-15 DIAGNOSIS — Z0000 Encounter for general adult medical examination without abnormal findings: Secondary | ICD-10-CM

## 2023-01-15 DIAGNOSIS — I700 Atherosclerosis of aorta: Secondary | ICD-10-CM | POA: Diagnosis not present

## 2023-01-15 LAB — COMPREHENSIVE METABOLIC PANEL WITH GFR
ALT: 12 U/L (ref 0–53)
AST: 19 U/L (ref 0–37)
Albumin: 4.2 g/dL (ref 3.5–5.2)
Alkaline Phosphatase: 63 U/L (ref 39–117)
BUN: 18 mg/dL (ref 6–23)
CO2: 28 meq/L (ref 19–32)
Calcium: 9.8 mg/dL (ref 8.4–10.5)
Chloride: 104 meq/L (ref 96–112)
Creatinine, Ser: 1.1 mg/dL (ref 0.40–1.50)
GFR: 71.61 mL/min
Glucose, Bld: 87 mg/dL (ref 70–99)
Potassium: 4.3 meq/L (ref 3.5–5.1)
Sodium: 141 meq/L (ref 135–145)
Total Bilirubin: 0.4 mg/dL (ref 0.2–1.2)
Total Protein: 7.1 g/dL (ref 6.0–8.3)

## 2023-01-15 LAB — LIPID PANEL
Cholesterol: 216 mg/dL — ABNORMAL HIGH (ref 0–200)
HDL: 59.6 mg/dL
LDL Cholesterol: 139 mg/dL — ABNORMAL HIGH (ref 0–99)
NonHDL: 156.2
Total CHOL/HDL Ratio: 4
Triglycerides: 88 mg/dL (ref 0.0–149.0)
VLDL: 17.6 mg/dL (ref 0.0–40.0)

## 2023-01-15 LAB — CBC WITH DIFFERENTIAL/PLATELET
Basophils Absolute: 0 10*3/uL (ref 0.0–0.1)
Basophils Relative: 0.3 % (ref 0.0–3.0)
Eosinophils Absolute: 0 10*3/uL (ref 0.0–0.7)
Eosinophils Relative: 0.5 % (ref 0.0–5.0)
HCT: 42 % (ref 39.0–52.0)
Hemoglobin: 14 g/dL (ref 13.0–17.0)
Lymphocytes Relative: 29.1 % (ref 12.0–46.0)
Lymphs Abs: 1.4 10*3/uL (ref 0.7–4.0)
MCHC: 33.2 g/dL (ref 30.0–36.0)
MCV: 97 fl (ref 78.0–100.0)
Monocytes Absolute: 0.4 10*3/uL (ref 0.1–1.0)
Monocytes Relative: 8.6 % (ref 3.0–12.0)
Neutro Abs: 2.9 10*3/uL (ref 1.4–7.7)
Neutrophils Relative %: 61.5 % (ref 43.0–77.0)
Platelets: 320 10*3/uL (ref 150.0–400.0)
RBC: 4.33 Mil/uL (ref 4.22–5.81)
RDW: 14.2 % (ref 11.5–15.5)
WBC: 4.6 10*3/uL (ref 4.0–10.5)

## 2023-01-15 LAB — PSA: PSA: 3.73 ng/mL (ref 0.10–4.00)

## 2023-01-15 NOTE — Patient Instructions (Addendum)
 Please stop by lab before you go If you have mychart- we will send your results within 3 business days of us  receiving them.  If you do not have mychart- we will call you about results within 5 business days of us  receiving them.  *please also note that you will see labs on mychart as soon as they post. I will later go in and write notes on them- will say notes from Dr. Katrinka   several areas that fit classic seborrheic dermatitis pattern- he has some topical antifungal he may try in these areas twice daily for 2 weeks- if that doesn't work can try 10 days of topical hydrocortisone 1% OTC (available over the counter without a prescription) and if not effective he will reach out to dermatology for next steps  Recommended follow up: Return in about 1 year (around 01/15/2024) for physical or sooner if needed.Schedule b4 you leave.

## 2023-01-15 NOTE — Progress Notes (Signed)
 Phone: [phone]   Subjective:  Patient presents today for their annual physical. Chief complaint-noted.   See problem oriented charting- ROS- full  review of systems was completed and negative  except for: Rash that he has worked with dermatology on, tinnitus  The following were reviewed and entered/updated in epic: Past Medical History:  Diagnosis Date   Anxiety    Depression    Detached retina    April 2012   Patient Active Problem List   Diagnosis Date Noted   Aortic atherosclerosis (HCC) 02/19/2022    Priority: High   Hyperlipemia 02/19/2022    Priority: Medium    History of retinal detachment 02/03/2020    Priority: Medium    Erectile dysfunction 02/28/2015    Priority: Medium    Anxiety state 10/22/2006    Priority: Medium    Retinal scar 02/03/2020    Priority: Low   Left epiretinal membrane 02/03/2020    Priority: Low   Past Surgical History:  Procedure Laterality Date   APPENDECTOMY     COLONOSCOPY  07/14/2010   hemorrhoids   DEEP NECK LYMPH NODE BIOPSY / EXCISION     rt neck   fracture left ankle     RETINAL DETACHMENT REPAIR W/ SCLERAL BUCKLE LE  04/2010   Family History  Problem Relation Age of Onset   Heart disease Mother        heart valve    Heart disease Father        late 46s, nonsmoker, chol over 300   Prostate cancer Father        late 62s   Hypertension Father    Arthritis Brother        psoriatic   Colon cancer Neg Hx    Colon polyps Neg Hx     Medications- reviewed and updated Current Outpatient Medications  Medication Sig Dispense Refill   clorazepate (TRANXENE) 3.75 MG tablet Take 3.75 mg by mouth 2 (two) times daily as needed for anxiety.     Fluocinolone Acetonide 0.01 % OIL Place 5 drops in ear(s) 2 (two) times daily.     FLUoxetine (PROZAC) 20 MG tablet Take 20 mg by mouth daily.     gabapentin (NEURONTIN) 400 MG capsule Take 400 mg by mouth 2 (two) times daily.     Multiple Vitamin (MULTIVITAMIN) tablet Take 1 tablet  by mouth daily.     temazepam (RESTORIL) 15 MG capsule Take 15 mg by mouth at bedtime.     No current facility-administered medications for this visit.    Allergies-reviewed and updated No Known Allergies  Social History   Social History Narrative   Married Jan 1984. 2 children - son and daughter. 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       Hobbies: exercise, camping, bass with church and runs sound board with church   Objective  Objective:  BP 118/78   Pulse 84   Temp (!) 97.2 F (36.2 C)   Ht 6' (1.829 m)   Wt 171 lb (77.6 kg)   SpO2 96%   BMI 23.19 kg/m  Gen: NAD, resting comfortably HEENT: Mucous membranes are moist. Oropharynx normal Neck: no thyromegaly CV: RRR no murmurs rubs or gallops Lungs: CTAB no crackles, wheeze, rhonchi Abdomen: soft/nontender/nondistended/normal bowel sounds. No rebound or  guarding.  Ext: no edema Skin: warm, dry, dry scaling beside nose bilaterally, bilateral ears with some scaling and mild erythema, noted at top of beard as well. Similar at umbilicus and gluteal crest as well as around glans penis (consistent with seborrheic dermatitis)  Neuro: grossly normal, moves all extremities, PERRLA    Assessment and Plan  63 y.o. male presenting for annual physical.  Health Maintenance counseling: 1. Anticipatory guidance: Patient counseled regarding regular dental exams -q6 months, eye exams - typically yearly with detached retina history,  avoiding smoking and second hand smoke, limiting alcohol to 2 beverages per day - dry January again and considering cutting liquor out- wants to limit to 2 a day, no illicit drugs .   2. Risk factor reduction:  Advised patient of need for regular exercise and diet rich and fruits and vegetables to reduce risk of heart attack and stroke.   Exercise-last year was working out regularly 3 to 4 days a week with cardio and doing some weights - very active this year but not in gym in particularly.  Diet/weight management-weight down 16 pounds in the last year- has worked on physicist, medical back up and doing well with that.  Wt Readings from Last 3 Encounters:  01/15/23 171 lb (77.6 kg)  01/11/22 187 lb 6.4 oz (85 kg)  04/10/21 180 lb (81.6 kg)  3. Immunizations/screenings/ancillary studies-declines flu, COVID vaccinations  Immunization History  Administered Date(s) Administered   Influenza Whole 11/01/2008   Influenza-Unspecified 10/15/2013, 11/18/2014, 10/19/2015, 10/13/2017, 10/03/2018   Td 01/02/2003   Tdap 02/18/2007, 05/23/2017   Zoster Recombinant(Shingrix ) 07/02/2017, 09/12/2017  4. Prostate cancer screening-  low risk prior trend- update psa today  Lab Results  Component Value Date   PSA 2.84 01/11/2022   PSA 2.91 03/13/2021   PSA 2.90 01/09/2021  5. Colon cancer screening - 04/10/21 with 10 year repeat  6. Skin cancer screening-annual dermatology visits.  advised regular sunscreen use. Denies worrisome, changing, or new skin lesions-see rash discussion below 7. Smoking associated screening (lung cancer screening, AAA screen 65-75, UA)-former smoker- quit 1980. AAA screening at 65 planned but otherwise no regular follow-up  8. STD screening - not needed as monogamous   Status of chronic or acute concerns   # Social-retired March 2024-enjoying it   #Rash S: Patient complains of rash on his head, bellybutton and ears.  He has tried over-the-counter cream the dermatologist recommended without benefit.  He has had these issues for about a year - woodson will help seborrheic dermatitis around nose but has noted in ears- spot on scalp and around base of glans penis and top of buttocks.  - for the ears had some drops that he used once a day with short term relif ROS-not ill appearing, no fever/chills. No new medications. Not  immunocompromised. No mucus membrane involvement.  A/P: several areas that fit classic seborrheic dermatitis pattern- he has some topical antifungal he may try in these areas twice daily for 2 weeks- if that doesn't work can try 10 days of topical hydrocortisone 1% OTC (available over the counter without a prescription) and if not effective he will reach out to dermatology for next steps   #hyperlipidemia- ct calcium scoring of 72.9 in 2024 at 57% # Aortic atherosclerosis S: Medication: he has cut out saturated fats and lost weight and wants to see if #s better- has wanted to hold off on medicine Lab Results  Component Value Date   CHOL 200 05/24/2022   HDL 59.90 05/24/2022  LDLCALC 118 (H) 05/24/2022   LDLDIRECT 140.0 07/16/2018   TRIG 112.0 05/24/2022   CHOLHDL 3 05/24/2022   A/P: mild poor control last year- hoping for further improvement with lifestyle changes- update today- wanting to hold off on statin unless sees progression on scores in 5 years Aortic atherosclerosis (presumed stable)- LDL goal ideally <70 - update lipids and continue lifestyle change with declining statin   # Anxiety-continues to be managed by psychiatry on clorazepate, fluoxetine, gabapentin, temazepam   # Nonpulsatile tinnitus.  Have recommended [facility] hearing test- has not done yet   Recommended follow up: Return in about 1 year (around 01/15/2024) for physical or sooner if needed.Schedule b4 you leave.  Lab/Order associations: fasting   ICD-10-CM   1. Preventative health care  Z00.00     2. Hyperlipidemia, unspecified hyperlipidemia type  E78.5 Comprehensive metabolic panel    CBC with Differential/Platelet    Lipid panel    3. Aortic atherosclerosis (HCC)  I70.0     4. Screening for prostate cancer  Z12.5 PSA      No orders of the defined types were placed in this encounter.   Return precautions advised.  Garnette Lukes, MD

## 2023-01-16 ENCOUNTER — Other Ambulatory Visit: Payer: Self-pay

## 2023-01-16 DIAGNOSIS — R972 Elevated prostate specific antigen [PSA]: Secondary | ICD-10-CM

## 2023-01-16 NOTE — Progress Notes (Signed)
 Referral placed to urology.

## 2023-06-20 ENCOUNTER — Encounter: Payer: Self-pay | Admitting: Family Medicine

## 2023-08-15 ENCOUNTER — Encounter: Payer: Self-pay | Admitting: Internal Medicine

## 2023-08-15 ENCOUNTER — Ambulatory Visit: Admitting: Internal Medicine

## 2023-08-15 VITALS — BP 104/78 | HR 84 | Ht 71.0 in | Wt 170.1 lb

## 2023-08-15 DIAGNOSIS — R6889 Other general symptoms and signs: Secondary | ICD-10-CM

## 2023-08-15 NOTE — Patient Instructions (Addendum)
 No abnormality felt today. We are in agreement to get urology to recheck at your December appointment and  also you can get Dr Katrinka to check at your January appointment.   Any concerns or changes call us  back.   _______________________________________________________  If your blood pressure at your visit was 140/90 or greater, please contact your primary care physician to follow up on this.  _______________________________________________________  If you are age 64 or older, your body mass index should be between 23-30. Your Body mass index is 23.73 kg/m. If this is out of the aforementioned range listed, please consider follow up with your Primary Care Provider.  If you are age 44 or younger, your body mass index should be between 19-25. Your Body mass index is 23.73 kg/m. If this is out of the aformentioned range listed, please consider follow up with your Primary Care Provider.   ________________________________________________________  The [facility] providers would like to encourage you to use MYCHART to communicate with providers for non-urgent requests or questions.  Due to long hold times on the telephone, sending your provider a message by [facility] may be a faster and more efficient way to get a response.  Please allow 48 business hours for a response.  Please remember that this is for non-urgent requests.  _______________________________________________________  [facility] is using a team-based approach to care.  Your team is made up of your doctor and two to three APPS. Our APPS (Nurse Practitioners and Physician Assistants) work with your physician to ensure care continuity for you. They are fully qualified to address your health concerns and develop a treatment plan. They communicate directly with your gastroenterologist to care for you. Seeing the Advanced Practice Practitioners on your physician's team can help you by facilitating care more promptly, often allowing  for earlier appointments, access to diagnostic testing, procedures, and other specialty referrals.     I appreciate the opportunity to care for you. Lupita Commander, MD, [facility]

## 2023-08-15 NOTE — Progress Notes (Signed)
 Alexander Gonzales 62 y.o. 06/11/1960 [id number]  Assessment & Plan:   Encounter Diagnosis  Name Primary?   Abnormal digital rectal exam Yes   I do not detect any abnormalities on rectal today.  I examined the mid left lateral and elbows down on the exam table in no polyps or rectal abnormality was palpated.  Colonoscopy 2023 without rectal abnormality.  He will monitor things.  He follows up with Dr. Shane in December and would recheck rectal exam then also.  CC: Katrinka Garnette KIDD, MD Steffan Shane, MD   Subjective:   Chief Complaint: Abnormal rectal exam  HPI 63 year old man status post normal screening colonoscopy 04/10/2021 and normal rectal exam at that time, presents after his urologist, Dr. Shane palpated abnormality on rectal exam abnormal polyp noted on left side of rectum during DRE at visit of 05/29/2023.  01/22/2023 exam by Dr. Shane did not reveal abnormality.  I have reviewed both of those notes.  This patient has noted a bit of mucus at times but no bleeding.  Bowel habits are normal otherwise.  He reports his dermatologist has him on Otezla for psoriasis and said that that could cause diarrhea.  He does have some intermittent pruritus ani. No Known Allergies Current Meds  Medication Sig   clorazepate (TRANXENE) 3.75 MG tablet Take 3.75 mg by mouth daily.   FLUoxetine (PROZAC) 20 MG tablet Take 20 mg by mouth daily.   gabapentin (NEURONTIN) 400 MG capsule Take 400 mg by mouth 2 (two) times daily.   Multiple Vitamin (MULTIVITAMIN) tablet Take 1 tablet by mouth daily.   OTEZLA 30 MG TABS Take 1 tablet by mouth 2 (two) times daily.   temazepam (RESTORIL) 15 MG capsule Take 15 mg by mouth at bedtime.   triamcinolone cream (KENALOG) 0.1 % Apply 1 Application topically as needed.   Past Medical History:  Diagnosis Date   Anxiety    BPH (benign prostatic hyperplasia)    Depression    Detached retina    April 2012   Past Surgical History:   Procedure Laterality Date   APPENDECTOMY     COLONOSCOPY  07/14/2010   hemorrhoids   DEEP NECK LYMPH NODE BIOPSY / EXCISION     rt neck   fracture left ankle     RETINAL DETACHMENT REPAIR W/ SCLERAL BUCKLE LE  04/2010   Social History   Social History Narrative   Married Jan 1984. 2 children - son and daughter. 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   Review of Systems As per HPI  Objective:   Physical Exam BP 104/78   Pulse 84   Ht 5' 11 (1.803 m)   Wt 170 lb 2 oz (77.2 kg)   BMI 23.73 kg/m  No acute distress  Rectal exam performed left lateral decubitus and elbows down on exam table.  The patient does have some old external hemorrhoids/tags.  There are some mild perianal erythema. Digital exam reveals normal rectal  mucosa no masses there is some formed stool palpable.  I do not detect any polyps or other abnormality.  Prostate is slightly enlarged but smooth and overall flat.

## 2024-01-16 ENCOUNTER — Encounter: Payer: Self-pay | Admitting: Family Medicine

## 2024-01-16 ENCOUNTER — Ambulatory Visit: Payer: Self-pay | Admitting: Family Medicine

## 2024-01-16 ENCOUNTER — Ambulatory Visit (INDEPENDENT_AMBULATORY_CARE_PROVIDER_SITE_OTHER): Payer: 59 | Admitting: Family Medicine

## 2024-01-16 VITALS — BP 100/74 | HR 67 | Temp 97.7°F | Ht 71.0 in | Wt 172.4 lb

## 2024-01-16 DIAGNOSIS — Z0000 Encounter for general adult medical examination without abnormal findings: Secondary | ICD-10-CM

## 2024-01-16 DIAGNOSIS — E785 Hyperlipidemia, unspecified: Secondary | ICD-10-CM

## 2024-01-16 DIAGNOSIS — Z125 Encounter for screening for malignant neoplasm of prostate: Secondary | ICD-10-CM | POA: Diagnosis not present

## 2024-01-16 LAB — COMPREHENSIVE METABOLIC PANEL WITH GFR
ALT: 14 U/L (ref 3–53)
AST: 23 U/L (ref 5–37)
Albumin: 4.4 g/dL (ref 3.5–5.2)
Alkaline Phosphatase: 61 U/L (ref 39–117)
BUN: 11 mg/dL (ref 6–23)
CO2: 30 meq/L (ref 19–32)
Calcium: 9.7 mg/dL (ref 8.4–10.5)
Chloride: 104 meq/L (ref 96–112)
Creatinine, Ser: 1.13 mg/dL (ref 0.40–1.50)
GFR: 68.85 mL/min
Glucose, Bld: 91 mg/dL (ref 70–99)
Potassium: 4 meq/L (ref 3.5–5.1)
Sodium: 139 meq/L (ref 135–145)
Total Bilirubin: 0.6 mg/dL (ref 0.2–1.2)
Total Protein: 7.9 g/dL (ref 6.0–8.3)

## 2024-01-16 LAB — CBC WITH DIFFERENTIAL/PLATELET
Basophils Absolute: 0 K/uL (ref 0.0–0.1)
Basophils Relative: 0.4 % (ref 0.0–3.0)
Eosinophils Absolute: 0 K/uL (ref 0.0–0.7)
Eosinophils Relative: 0.4 % (ref 0.0–5.0)
HCT: 40.7 % (ref 39.0–52.0)
Hemoglobin: 13.9 g/dL (ref 13.0–17.0)
Lymphocytes Relative: 31.4 % (ref 12.0–46.0)
Lymphs Abs: 1.4 K/uL (ref 0.7–4.0)
MCHC: 34 g/dL (ref 30.0–36.0)
MCV: 94.5 fl (ref 78.0–100.0)
Monocytes Absolute: 0.5 K/uL (ref 0.1–1.0)
Monocytes Relative: 11.3 % (ref 3.0–12.0)
Neutro Abs: 2.6 K/uL (ref 1.4–7.7)
Neutrophils Relative %: 56.5 % (ref 43.0–77.0)
Platelets: 284 K/uL (ref 150.0–400.0)
RBC: 4.31 Mil/uL (ref 4.22–5.81)
RDW: 13.6 % (ref 11.5–15.5)
WBC: 4.6 K/uL (ref 4.0–10.5)

## 2024-01-16 LAB — LIPID PANEL
Cholesterol: 218 mg/dL — ABNORMAL HIGH (ref 28–200)
HDL: 69.9 mg/dL
LDL Cholesterol: 132 mg/dL — ABNORMAL HIGH (ref 10–99)
NonHDL: 147.84
Total CHOL/HDL Ratio: 3
Triglycerides: 79 mg/dL (ref 10.0–149.0)
VLDL: 15.8 mg/dL (ref 0.0–40.0)

## 2024-01-16 NOTE — Patient Instructions (Addendum)
 Please stop by lab before you go If you have mychart- we will send your results within 3 business days of us  receiving them.  If you do not have mychart- we will call you about results within 5 business days of us  receiving them.  *please also note that you will see labs on mychart as soon as they post. I will later go in and write notes on them- will say notes from Dr. Katrinka   Recommended follow up: Return in about 1 year (around 01/15/2025) for welcome to medicare. And remind me we will do EKG

## 2024-01-16 NOTE — Progress Notes (Signed)
 " Phone: [phone]   Subjective:  Patient presents today for their annual physical. Chief complaint-noted.   See problem oriented charting- ROS- full  review of systems was completed and negative  except for topics noted under acute/chronic concerns  The following were reviewed and entered/updated in epic: Past Medical History:  Diagnosis Date   Anxiety    BPH (benign prostatic hyperplasia)    Depression    Detached retina    April 2012   Patient Active Problem List   Diagnosis Date Noted   Aortic atherosclerosis 02/19/2022    Priority: High   Hyperlipemia 02/19/2022    Priority: Medium    History of retinal detachment 02/03/2020    Priority: Medium    Erectile dysfunction 02/28/2015    Priority: Medium    Anxiety state 10/22/2006    Priority: Medium    Retinal scar 02/03/2020    Priority: Low   Left epiretinal membrane 02/03/2020    Priority: Low   Past Surgical History:  Procedure Laterality Date   APPENDECTOMY     COLONOSCOPY  07/14/2010   hemorrhoids   DEEP NECK LYMPH NODE BIOPSY / EXCISION     rt neck   fracture left ankle     RETINAL DETACHMENT REPAIR W/ SCLERAL BUCKLE LE  04/2010    Family History  Problem Relation Age of Onset   Heart disease Mother        heart valve    Miscarriages / Stillbirths Mother    Heart disease Father        late 14s, nonsmoker, chol over 300   Prostate cancer Father        late 70s   Hypertension Father    Depression Father    Cancer Father    Arthritis Brother        psoriatic   Early death Brother    Colon cancer Neg Hx    Colon polyps Neg Hx     Medications- reviewed and updated Current Outpatient Medications  Medication Sig Dispense Refill   clorazepate (TRANXENE) 3.75 MG tablet Take 3.75 mg by mouth daily.     fluorouracil (EFUDEX) 5 % cream 1 Application.     FLUoxetine (PROZAC) 20 MG tablet Take 20 mg by mouth daily.     gabapentin (NEURONTIN) 400 MG capsule Take 400 mg by mouth 2 (two) times daily.      Multiple Vitamin (MULTIVITAMIN) tablet Take 1 tablet by mouth daily.     OTEZLA 30 MG TABS Take 1 tablet by mouth 2 (two) times daily.     temazepam (RESTORIL) 15 MG capsule Take 15 mg by mouth at bedtime.     triamcinolone cream (KENALOG) 0.1 % Apply 1 Application topically as needed.     No current facility-administered medications for this visit.    Allergies-reviewed and updated Allergies[1]  Social History   Social History Narrative   Married Jan 1984. 2 children - son and daughter. 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 Oropharynx normal Neck: no thyromegaly CV: RRR no murmurs rubs or gallops Lungs: CTAB no crackles, wheeze, rhonchi Abdomen: soft/nontender/nondistended/normal bowel sounds. No rebound or guarding.  Ext: no edema Skin: warm, dry Neuro: grossly normal, moves all extremities, PERRLA   Assessment and Plan  64 y.o. male presenting for annual physical.  Health Maintenance counseling: 1. Anticipatory guidance: Patient counseled regarding regular dental exams -q6 months, eye exams -yearly,  avoiding smoking and second hand smoke , limiting alcohol to 2 beverages per day - 14 a week, no illicit drugs .   2. Risk factor reduction:  Advised patient of need for regular exercise and diet rich and fruits and vegetables to reduce risk of heart attack and  stroke.  Exercise- exercising regularly- stationary bike and weights as well. Working on his core as well Diet/weight management-healthy weight still- tries to eat reasonably healthy.  Wt Readings from Last 3 Encounters:  01/16/24 172 lb 6.4 oz (78.2 kg)  08/15/23 170 lb 2 oz (77.2 kg)  01/15/23 171 lb (77.6 kg)  3. Immunizations/screenings/ancillary studies- declines COVID, flu. Prevnar 20 holding off.  Immunization History  Administered Date(s) Administered   Influenza Whole 11/01/2008   Influenza-Unspecified 10/15/2013, 11/18/2014, 10/19/2015, 10/13/2017, 10/03/2018   Td 01/02/2003   Tdap 02/18/2007, 05/23/2017   Zoster Recombinant(Shingrix ) 07/02/2017, 09/12/2017  4. Prostate cancer screening-  has seen urology with elevated PSA. They are considering MRI and he opts out. 3.96 in may it appears. He reports had 2 months ago as well- we will defer PSA today Lab Results  Component Value Date   PSA 3.73 01/15/2023   PSA 2.84 01/11/2022   PSA 2.91 03/13/2021   5. Colon cancer screening - 04/10/21 with 10 year repeat 6. Skin cancer screening- dermatology regularly. advised regular sunscreen use. Denies worrisome, changing, or new skin lesions.  7. Smoking associated screening (lung cancer screening, AAA screen 65-75, UA)- former smoker- cigars years ago 1980s quit 8. STD screening - only active with wife  Status of chronic or acute concerns   #social update- still enjoying retirement -some arthritic changes  #stool concerns- if has glass of wine does ok but if does bourbon or vodka gets churning in stomach and gassy sound. Can get some mucus with this next morning. Without that type of alcohol no mucus though. Up to date on colonoscopy. No blood or dark black stool. Does have itchy hemorrhoid tags.    #hyperlipidemia- ct calcium scoring of 72.9 in 2024 at 57% # Aortic atherosclerosis S: Medication: not interested in medication unless levels  - no chest pain or shortness of breath   Lab Results  Component Value Date   CHOL 216 (H) 01/15/2023   HDL 59.60 01/15/2023   LDLCALC 139 (H) 01/15/2023   LDLDIRECT 140.0 07/16/2018   TRIG 88.0 01/15/2023   CHOLHDL 4 01/15/2023   A/P: nonobstructive coronary artery disease asymptomatic - wants to hold off on medicine. Recheck calcium score at least every 5 years- hed be more interested if progression. Also check LPA- if elevated would point away from statins anyway.  - mentioned vegan diet -EKG discussed and likely at wtm exam   # Anxiety-continues to be managed by psychiatry on clorazepate, fluoxetine, gabapentin, temazepam   # Nonpulsatile tinnitus.  Have recommended [facility]  hearing test again today   #psoriasis- otezla from Dr. Rodgers - this has been helpful- rash much better.   Recommended follow up: No follow-ups on file.  Lab/Order associations: fasting   ICD-10-CM   1. Preventative health care  Z00.00       No orders of the defined types were placed in this encounter.   Return precautions advised.  Garnette Lukes, MD      [1] No Known Allergies  "

## 2024-01-24 LAB — LIPOPROTEIN A (LPA): Lipoprotein (a): 34 nmol/L

## 2025-01-19 ENCOUNTER — Encounter: Admitting: Family Medicine
